# Patient Record
Sex: Female | Born: 1990
Health system: Southern US, Community
[De-identification: ages and names within clinical notes are randomized; demographics above are authoritative.]

## PROBLEM LIST (undated history)

## (undated) DIAGNOSIS — F419 Anxiety disorder, unspecified: Secondary | ICD-10-CM

## (undated) DIAGNOSIS — Z789 Other specified health status: Secondary | ICD-10-CM

## (undated) DIAGNOSIS — F32A Depression, unspecified: Secondary | ICD-10-CM

## (undated) HISTORY — DX: Anxiety disorder, unspecified: F41.9

## (undated) HISTORY — DX: Depression, unspecified: F32.A

## (undated) HISTORY — PX: WISDOM TOOTH EXTRACTION: SHX21

---
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
## 2009-10-07 ENCOUNTER — Ambulatory Visit (HOSPITAL_COMMUNITY): Payer: Self-pay | Admitting: Psychiatry

## 2009-10-15 ENCOUNTER — Ambulatory Visit (HOSPITAL_COMMUNITY): Payer: Self-pay | Admitting: Psychiatry

## 2009-10-25 ENCOUNTER — Ambulatory Visit (HOSPITAL_COMMUNITY): Payer: Self-pay | Admitting: Psychiatry

## 2009-11-20 ENCOUNTER — Ambulatory Visit (HOSPITAL_COMMUNITY): Payer: Self-pay | Admitting: Psychiatry

## 2009-12-04 ENCOUNTER — Ambulatory Visit (HOSPITAL_COMMUNITY): Payer: Self-pay | Admitting: Psychiatry

## 2010-02-08 ENCOUNTER — Encounter: Payer: Self-pay | Admitting: Family Medicine

## 2010-04-22 LAB — URINALYSIS, ROUTINE W REFLEX MICROSCOPIC
Bilirubin Urine: NEGATIVE
Glucose, UA: NEGATIVE mg/dL
Hgb urine dipstick: NEGATIVE
Ketones, ur: NEGATIVE mg/dL
Nitrite: NEGATIVE
Protein, ur: NEGATIVE mg/dL
Specific Gravity, Urine: 1.02 (ref 1.005–1.030)
Urobilinogen, UA: 0.2 mg/dL (ref 0.0–1.0)
pH: 7 (ref 5.0–8.0)

## 2010-04-30 LAB — URINALYSIS, ROUTINE W REFLEX MICROSCOPIC
Bilirubin Urine: NEGATIVE
Nitrite: NEGATIVE
Specific Gravity, Urine: 1.02 (ref 1.005–1.030)
Urobilinogen, UA: 0.2 mg/dL (ref 0.0–1.0)

## 2010-04-30 LAB — POCT I-STAT, CHEM 8
Chloride: 105 mEq/L (ref 96–112)
Creatinine, Ser: 0.8 mg/dL (ref 0.4–1.2)
Glucose, Bld: 86 mg/dL (ref 70–99)
Hemoglobin: 12.9 g/dL (ref 12.0–15.0)
Potassium: 3.7 mEq/L (ref 3.5–5.1)

## 2010-04-30 LAB — URINE MICROSCOPIC-ADD ON

## 2010-10-20 LAB — URINALYSIS, ROUTINE W REFLEX MICROSCOPIC
Glucose, UA: NEGATIVE
Hgb urine dipstick: NEGATIVE
Specific Gravity, Urine: 1.021
pH: 7

## 2012-10-27 ENCOUNTER — Other Ambulatory Visit: Payer: Self-pay | Admitting: Obstetrics & Gynecology

## 2013-05-17 ENCOUNTER — Other Ambulatory Visit: Payer: Self-pay | Admitting: Obstetrics & Gynecology

## 2014-01-08 ENCOUNTER — Other Ambulatory Visit: Payer: Self-pay | Admitting: Obstetrics & Gynecology

## 2014-01-16 ENCOUNTER — Encounter: Payer: Self-pay | Admitting: Obstetrics & Gynecology

## 2014-01-16 ENCOUNTER — Ambulatory Visit (INDEPENDENT_AMBULATORY_CARE_PROVIDER_SITE_OTHER): Payer: 59 | Admitting: Obstetrics & Gynecology

## 2014-01-16 ENCOUNTER — Other Ambulatory Visit (HOSPITAL_COMMUNITY)
Admission: RE | Admit: 2014-01-16 | Discharge: 2014-01-16 | Disposition: A | Discharge status: Home / Self Care | Payer: 59 | Source: Ambulatory Visit | Attending: Obstetrics & Gynecology | Admitting: Obstetrics & Gynecology

## 2014-01-16 VITALS — BP 110/60 | Ht 67.0 in | Wt 125.0 lb

## 2014-01-16 DIAGNOSIS — Z01419 Encounter for gynecological examination (general) (routine) without abnormal findings: Secondary | ICD-10-CM | POA: Diagnosis not present

## 2014-01-16 NOTE — Progress Notes (Signed)
Patient ID: Doris Tyler, female   DOB: 03/03/1990, 23 y.o.   MRN: [id number] Subjective:     Doris Tyler is a 23 y.o. female here for a routine exam.  Patient's last menstrual period was 01/16/2014. No obstetric history on file. Birth Control Method:  megestrol Menstrual Calendar(currently): q 3 months  Current complaints: none.   Current acute medical issues:  none   Recent Gynecologic History Patient's last menstrual period was 01/16/2014. Last Pap: 2012,  normal Last mammogram: na,    History reviewed. No pertinent past medical history.  Past Surgical History  Procedure Laterality Date  . Wisdom tooth extraction      OB History    No data available      History   Social History  . Marital Status: Single    Spouse Name: N/A    Number of Children: N/A  . Years of Education: N/A   Social History Main Topics  . Smoking status: Never Smoker   . Smokeless tobacco: None  . Alcohol Use: None  . Drug Use: None  . Sexual Activity: None   Other Topics Concern  . None   Social History Narrative  . None    History reviewed. No pertinent family history.  Current outpatient prescriptions: megestrol (MEGACE) 40 MG tablet, TAKE 1 TABLET EVERY DAY, Disp: 30 tablet, Rfl: 11  Review of Systems  Review of Systems  Constitutional: Negative for fever, chills, weight loss, malaise/fatigue and diaphoresis.  HENT: Negative for hearing loss, ear pain, nosebleeds, congestion, sore throat, neck pain, tinnitus and ear discharge.   Eyes: Negative for blurred vision, double vision, photophobia, pain, discharge and redness.  Respiratory: Negative for cough, hemoptysis, sputum production, shortness of breath, wheezing and stridor.   Cardiovascular: Negative for chest pain, palpitations, orthopnea, claudication, leg swelling and PND.  Gastrointestinal: negative for abdominal pain. Negative for heartburn, nausea, vomiting, diarrhea, constipation, blood in stool and melena.   Genitourinary: Negative for dysuria, urgency, frequency, hematuria and flank pain.  Musculoskeletal: Negative for myalgias, back pain, joint pain and falls.  Skin: Negative for itching and rash.  Neurological: Negative for dizziness, tingling, tremors, sensory change, speech change, focal weakness, seizures, loss of consciousness, weakness and headaches.  Endo/Heme/Allergies: Negative for environmental allergies and polydipsia. Does not bruise/bleed easily.  Psychiatric/Behavioral: Negative for depression, suicidal ideas, hallucinations, memory loss and substance abuse. The patient is not nervous/anxious and does not have insomnia.        Objective:  Blood pressure 110/60, height 5\' 7"  (1.702 m), weight 125 lb (56.7 kg), last menstrual period 01/16/2014.   Physical Exam  Vitals reviewed. Constitutional: She is oriented to person, place, and time. She appears well-developed and well-nourished.  HENT:  Head: Normocephalic and atraumatic.        Right Ear: External ear normal.  Left Ear: External ear normal.  Nose: Nose normal.  Mouth/Throat: Oropharynx is clear and moist.  Eyes: Conjunctivae and EOM are normal. Pupils are equal, round, and reactive to light. Right eye exhibits no discharge. Left eye exhibits no discharge. No scleral icterus.  Neck: Normal range of motion. Neck supple. No tracheal deviation present. No thyromegaly present.  Cardiovascular: Normal rate, regular rhythm, normal heart sounds and intact distal pulses.  Exam reveals no gallop and no friction rub.   No murmur heard. Respiratory: Effort normal and breath sounds normal. No respiratory distress. She has no wheezes. She has no rales. She exhibits no tenderness.  GI: Soft. Bowel sounds are normal. She exhibits  no distension and no mass. There is no tenderness. There is no rebound and no guarding.  Genitourinary:  Breasts no masses skin changes or nipple changes bilaterally      Vulva is normal without lesions Vagina  is pink moist without discharge Cervix normal in appearance and pap is done Uterus is normal size shape and contour Adnexa is negative with normal sized ovaries    Musculoskeletal: Normal range of motion. She exhibits no edema and no tenderness.  Neurological: She is alert and oriented to person, place, and time. She has normal reflexes. She displays normal reflexes. No cranial nerve deficit. She exhibits normal muscle tone. Coordination normal.  Skin: Skin is warm and dry. No rash noted. No erythema. No pallor.  Psychiatric: She has a normal mood and affect. Her behavior is normal. Judgment and thought content normal.       Assessment:    Healthy female exam.    Plan:    Contraception: progesterone only megestrol. Follow up in: 1 year.

## 2014-01-17 LAB — CYTOLOGY - PAP

## 2014-02-27 ENCOUNTER — Ambulatory Visit: Payer: Self-pay | Admitting: Family Medicine

## 2014-02-28 ENCOUNTER — Ambulatory Visit (INDEPENDENT_AMBULATORY_CARE_PROVIDER_SITE_OTHER): Payer: 59 | Admitting: Family Medicine

## 2014-02-28 ENCOUNTER — Encounter: Payer: Self-pay | Admitting: Family Medicine

## 2014-02-28 VITALS — BP 104/60 | Temp 99.1°F | Ht 67.0 in | Wt 130.0 lb

## 2014-02-28 DIAGNOSIS — R5383 Other fatigue: Secondary | ICD-10-CM

## 2014-02-28 MED ORDER — ONDANSETRON HCL 4 MG PO TABS: ORAL_TABLET | ORAL | Status: DC

## 2014-02-28 NOTE — Progress Notes (Signed)
   Subjective:    Patient ID: Doris Tyler, female    DOB: 09/25/1990, 24 y.o.   MRN: [id number] Patient arrives with new and old problems. Emesis  This is a new problem. Associated symptoms include abdominal pain and diarrhea. Associated symptoms comments: weakness.    First sttarted mon morn when woke up  Stomach was causing sharp pain with cramps  Felt a little better  Started to vomit  Feeling worse  Loose stools and diarrhea, looser than normal  Appetite not the best  Felt a little warm Dim energy  Pt notes spells of wekness, off and on for a long time. Still walks and jogs three to four times per wk  heavt menstrual cycles. Never diagnoses having anemia. States blood work not drawn.  Going on for more than a yr  Eats overall a healthy diet, not on soda drinks a little wate  Eats candy at times  Some stressors but not excessive, does not feel she is depressed, but wonders if her mood is dosnt a bit. Was seeing a counselor peggy bynum Claims no significant depression at this time.  Review of Systems  Gastrointestinal: Positive for vomiting, abdominal pain and diarrhea.   No chest pain no headache no neck pain no abdominal pain decent sleep hygiene ROS otherwise negative    Objective:   Physical Exam   Alert mild malaise no acute distress HEENT neck supple funduscopic exam normal lungs clear. Heart rare rhythm abdomen hyperactive bowel sounds diffuse mild upper abdominal tenderness no discrete point tenderness     Assessment & Plan:  Impression 1 acute viral gastroenteritis #2 chronic fatigue intermittent discussed. Potentially an emotional component having had some counseling lately. Patient curious about blood work status plan 25 minutes spent most in discussion. Exercises sleep hygiene discussed. Zofran. For nausea. Warning signs discussed for abdomen. Dietary changes discussed. Appropriate blood work to assess fatigue along with appropriate screening blood  work. Further recommendations based results. WSL

## 2014-07-16 ENCOUNTER — Other Ambulatory Visit: Payer: Self-pay | Admitting: Obstetrics & Gynecology

## 2014-08-23 ENCOUNTER — Emergency Department (HOSPITAL_COMMUNITY): Payer: 59

## 2014-08-23 ENCOUNTER — Emergency Department (HOSPITAL_COMMUNITY)
Admission: EM | Admit: 2014-08-23 | Discharge: 2014-08-23 | Disposition: A | Discharge status: Home / Self Care | Payer: 59 | Attending: Emergency Medicine | Admitting: Emergency Medicine

## 2014-08-23 ENCOUNTER — Encounter (HOSPITAL_COMMUNITY): Payer: Self-pay | Admitting: Emergency Medicine

## 2014-08-23 DIAGNOSIS — Z3202 Encounter for pregnancy test, result negative: Secondary | ICD-10-CM | POA: Insufficient documentation

## 2014-08-23 DIAGNOSIS — K2970 Gastritis, unspecified, without bleeding: Secondary | ICD-10-CM | POA: Diagnosis not present

## 2014-08-23 DIAGNOSIS — R1013 Epigastric pain: Secondary | ICD-10-CM

## 2014-08-23 DIAGNOSIS — R109 Unspecified abdominal pain: Secondary | ICD-10-CM | POA: Diagnosis present

## 2014-08-23 LAB — CBC WITH DIFFERENTIAL/PLATELET
BASOS PCT: 1 % (ref 0–1)
Basophils Absolute: 0 10*3/uL (ref 0.0–0.1)
Eosinophils Absolute: 0 10*3/uL (ref 0.0–0.7)
Eosinophils Relative: 0 % (ref 0–5)
HEMATOCRIT: 39.3 % (ref 36.0–46.0)
Hemoglobin: 13.5 g/dL (ref 12.0–15.0)
Lymphocytes Relative: 48 % — ABNORMAL HIGH (ref 12–46)
Lymphs Abs: 2.2 10*3/uL (ref 0.7–4.0)
MCH: 32.6 pg (ref 26.0–34.0)
MCHC: 34.4 g/dL (ref 30.0–36.0)
MCV: 94.9 fL (ref 78.0–100.0)
MONO ABS: 0.6 10*3/uL (ref 0.1–1.0)
MONOS PCT: 12 % (ref 3–12)
Neutro Abs: 1.9 10*3/uL (ref 1.7–7.7)
Neutrophils Relative %: 39 % — ABNORMAL LOW (ref 43–77)
PLATELETS: 255 10*3/uL (ref 150–400)
RBC: 4.14 MIL/uL (ref 3.87–5.11)
RDW: 12.6 % (ref 11.5–15.5)
WBC: 4.7 10*3/uL (ref 4.0–10.5)

## 2014-08-23 LAB — COMPREHENSIVE METABOLIC PANEL
ALK PHOS: 66 U/L (ref 38–126)
ALT: 22 U/L (ref 14–54)
ANION GAP: 10 (ref 5–15)
AST: 35 U/L (ref 15–41)
Albumin: 4.3 g/dL (ref 3.5–5.0)
BUN: 14 mg/dL (ref 6–20)
CHLORIDE: 99 mmol/L — AB (ref 101–111)
CO2: 25 mmol/L (ref 22–32)
CREATININE: 0.77 mg/dL (ref 0.44–1.00)
Calcium: 9.1 mg/dL (ref 8.9–10.3)
Glucose, Bld: 82 mg/dL (ref 65–99)
Potassium: 3.7 mmol/L (ref 3.5–5.1)
SODIUM: 134 mmol/L — AB (ref 135–145)
Total Bilirubin: 0.9 mg/dL (ref 0.3–1.2)
Total Protein: 8 g/dL (ref 6.5–8.1)

## 2014-08-23 LAB — URINALYSIS, ROUTINE W REFLEX MICROSCOPIC
GLUCOSE, UA: NEGATIVE mg/dL
Hgb urine dipstick: NEGATIVE
LEUKOCYTES UA: NEGATIVE
Nitrite: NEGATIVE
PH: 6 (ref 5.0–8.0)
Protein, ur: NEGATIVE mg/dL
Specific Gravity, Urine: 1.025 (ref 1.005–1.030)
UROBILINOGEN UA: 1 mg/dL (ref 0.0–1.0)

## 2014-08-23 LAB — POC URINE PREG, ED: Preg Test, Ur: NEGATIVE

## 2014-08-23 LAB — LIPASE, BLOOD: LIPASE: 16 U/L — AB (ref 22–51)

## 2014-08-23 MED ORDER — PANTOPRAZOLE SODIUM 40 MG PO TBEC: 40.0000 mg | DELAYED_RELEASE_TABLET | Freq: Every day | ORAL | Status: DC

## 2014-08-23 MED ORDER — PANTOPRAZOLE SODIUM 40 MG PO TBEC
40.0000 mg | DELAYED_RELEASE_TABLET | Freq: Once | ORAL | Status: AC
  Administered 2014-08-23: 40 mg via ORAL
  Filled 2014-08-23: qty 1

## 2014-08-23 MED ORDER — METOCLOPRAMIDE HCL 5 MG/ML IJ SOLN
10.0000 mg | Freq: Once | INTRAMUSCULAR | Status: AC
  Administered 2014-08-23: 10 mg via INTRAVENOUS
  Filled 2014-08-23: qty 2

## 2014-08-23 NOTE — ED Notes (Signed)
abdominal pain x 1 week, woke her up during the night, nausea, not vomiting or diarrhea

## 2014-08-23 NOTE — Discharge Instructions (Signed)
Gastritis, Adult °Gastritis is soreness and puffiness (inflammation) of the lining of the stomach. If you do not get help, gastritis can cause bleeding and sores (ulcers) in the stomach. °HOME CARE  °· Only take medicine as told by your doctor. °· If you were given antibiotic medicines, take them as told. Finish the medicines even if you start to feel better. °· Drink enough fluids to keep your pee (urine) clear or pale yellow. °· Avoid foods and drinks that make your problems worse. Foods you may want to avoid include: °¨ Caffeine or alcohol. °¨ Chocolate. °¨ Mint. °¨ Garlic and onions. °¨ Spicy foods. °¨ Citrus fruits, including oranges, lemons, or limes. °¨ Food containing tomatoes, including sauce, chili, salsa, and pizza. °¨ Fried and fatty foods. °· Eat small meals throughout the day instead of large meals. °GET HELP RIGHT AWAY IF:  °· You have black or dark red poop (stools). °· You throw up (vomit) blood. It may look like coffee grounds. °· You cannot keep fluids down. °· Your belly (abdominal) pain gets worse. °· You have a fever. °· You do not feel better after 1 week. °· You have any other questions or concerns. °MAKE SURE YOU:  °· Understand these instructions. °· Will watch your condition. °· Will get help right away if you are not doing well or get worse. °Document Released: 06/24/2007 Document Revised: 03/30/2011 Document Reviewed: 02/18/2011 °[employer]® Patient Information ©2015 [employer]. This information is not intended to replace advice given to you by your health care provider. Make sure you discuss any questions you have with your health care provider. ° °

## 2014-08-26 NOTE — ED Provider Notes (Signed)
CSN: [id number]     Arrival date & time 08/23/14  4098 History   First MD Initiated Contact with Patient 08/23/14 1041     Chief Complaint  Patient presents with  . Abdominal Pain     (Consider location/radiation/quality/duration/timing/severity/associated sxs/prior Treatment) HPI   Doris Tyler is a 24 y.o. female who presents to the Emergency Department complaining of abdominal pain for one week.  Pain has been intermittent but became worse on the evening prior to ed arrival.  She describes a dull, intermittently sharp pain to her upper abdominal pain.  She reports nausea without vomiting or diarrhea.  Pain is not associated with food intake.  She denies fever, chills, back pain, shortness of breath, dysuria or pelvic pain.  Nothing makes the better better or worse.     History reviewed. No pertinent past medical history. Past Surgical History  Procedure Laterality Date  . Wisdom tooth extraction     No family history on file. History  Substance Use Topics  . Smoking status: Never Smoker   . Smokeless tobacco: Not on file  . Alcohol Use: 0.0 oz/week    0 Standard drinks or equivalent per week     Comment: occ   OB History    No data available     Review of Systems  Constitutional: Negative for fever, chills and appetite change.  Respiratory: Negative for chest tightness and shortness of breath.   Cardiovascular: Negative for chest pain.  Gastrointestinal: Positive for nausea and abdominal pain. Negative for vomiting, diarrhea and blood in stool.  Genitourinary: Negative for dysuria, flank pain, decreased urine volume, vaginal bleeding, vaginal discharge, difficulty urinating and pelvic pain.  Musculoskeletal: Negative for back pain.  Skin: Negative for color change and rash.  Neurological: Negative for dizziness, weakness and numbness.  Hematological: Negative for adenopathy.  All other systems reviewed and are negative.     Allergies  Review of patient's  allergies indicates no known allergies.  Home Medications   Prior to Admission medications   Medication Sig Start Date End Date Taking? Authorizing Provider  megestrol (MEGACE) 40 MG tablet TAKE 1 TABLET EVERY DAY 07/17/14  Yes Lazaro Arms, MD  Phenylephrine-DM-GG-APAP (MUCINEX FAST-MAX SEVERE COLD) 5-10-200-325 MG TABS Take 2 tablets by mouth daily as needed (cold).   Yes Historical Provider, MD  ondansetron (ZOFRAN) 4 MG tablet Take 1 tablet po every 6-8 hours prn Patient not taking: Reported on 08/23/2014 02/28/14   Merlyn Albert, MD  pantoprazole (PROTONIX) 40 MG tablet Take 1 tablet (40 mg total) by mouth daily. 08/23/14   Raphel Stickles, PA-C   BP 124/82 mmHg  Pulse 76  Temp(Src) 98.3 F (36.8 C) (Oral)  Resp 16  Ht 5\' 8"  (1.727 m)  Wt 125 lb (56.7 kg)  BMI 19.01 kg/m2  SpO2 100%  LMP 07/23/2014 Physical Exam  Constitutional: She is oriented to person, place, and time. She appears well-developed and well-nourished. No distress.  HENT:  Head: Normocephalic and atraumatic.  Mouth/Throat: Oropharynx is clear and moist.  Cardiovascular: Normal rate, regular rhythm, normal heart sounds and intact distal pulses.   No murmur heard. Pulmonary/Chest: Effort normal and breath sounds normal. No respiratory distress.  Abdominal: Soft. Normal appearance and bowel sounds are normal. She exhibits no distension and no mass. There is no hepatosplenomegaly. There is tenderness in the epigastric area. There is no rebound, no guarding and no CVA tenderness.  epigastric pain.  No guarding or rebound tenderness.  No RLQ tenderness.  Musculoskeletal: Normal range of motion. She exhibits no edema.  Neurological: She is alert and oriented to person, place, and time. She exhibits normal muscle tone. Coordination normal.  Skin: Skin is warm and dry.  Nursing note and vitals reviewed.   ED Course  Procedures (including critical care time) Labs Review Labs Reviewed  LIPASE, BLOOD - Abnormal;  Notable for the following:    Lipase 16 (*)    All other components within normal limits  COMPREHENSIVE METABOLIC PANEL - Abnormal; Notable for the following:    Sodium 134 (*)    Chloride 99 (*)    All other components within normal limits  URINALYSIS, ROUTINE W REFLEX MICROSCOPIC (NOT AT [facility]) - Abnormal; Notable for the following:    Bilirubin Urine SMALL (*)    Ketones, ur >80 (*)    All other components within normal limits  CBC WITH DIFFERENTIAL/PLATELET - Abnormal; Notable for the following:    Neutrophils Relative % 39 (*)    Lymphocytes Relative 48 (*)    All other components within normal limits  POC URINE PREG, ED    Imaging Review US Abdomen Limited  08/23/2014   CLINICAL DATA:  Right upper quadrant and epigastric pain for 1 week with nausea  EXAM: US ABDOMEN LIMITED - RIGHT UPPER QUADRANT  COMPARISON:  None.  FINDINGS: Gallbladder:  No gallstones or wall thickening visualized. There is no pericholecystic fluid. No sonographic Murphy sign noted.  Common bile duct:  Diameter: 2 mm. There is no intrahepatic or extrahepatic biliary duct dilatation.  Liver:  No focal lesion identified. Within normal limits in parenchymal echogenicity.  IMPRESSION: Study within normal limits.   Electronically Signed   By: Bretta Bang III M.D.   On: 08/23/2014 14:10     EKG Interpretation None      MDM   Final diagnoses:  Gastritis  Epigastric pain    Pt is well appearing, vitals stable.  [place] is neg for GB dz.  No concerning sx's for surgical abd.  Sx's likely related to gastritis.    She is feeling better after medication.  Appears stable for d/c.  Agrees to close PMD f/u and advised to return here for any worsening sx's.      Pauline Aus, PA-C 08/26/14 1930  Vanetta Mulders, MD 09/08/14 [phone]

## 2014-12-17 ENCOUNTER — Ambulatory Visit (INDEPENDENT_AMBULATORY_CARE_PROVIDER_SITE_OTHER): Payer: 59 | Admitting: Family Medicine

## 2014-12-17 ENCOUNTER — Encounter: Payer: Self-pay | Admitting: Family Medicine

## 2014-12-17 VITALS — Temp 98.6°F | Ht 67.0 in | Wt 133.6 lb

## 2014-12-17 DIAGNOSIS — R1013 Epigastric pain: Secondary | ICD-10-CM

## 2014-12-17 NOTE — Progress Notes (Signed)
   Subjective:    Patient ID: Doris Tyler, female    DOB: 07/01/1990, 24 y.o.   MRN: [id number]  Abdominal Pain This is a new problem. The current episode started today. Associated symptoms include diarrhea and nausea. Pertinent negatives include no fever.   Patient with significant abdominal pain some vomiting and diarrhea earlier today abdominal pain somewhat better nausea is better she denies high fever chills no prior troubles with this Patient did go to the ER back in August for similar symptoms, ultrasound negative for any type of gallstones  Review of Systems  Constitutional: Negative for fever and fatigue.  HENT: Negative for congestion.   Respiratory: Negative for cough.   Gastrointestinal: Positive for nausea, abdominal pain and diarrhea.       Objective:   Physical Exam  Constitutional: She appears well-developed.  Cardiovascular: Normal rate, regular rhythm and normal heart sounds.   No murmur heard. Pulmonary/Chest: Effort normal and breath sounds normal.  Abdominal: Soft. She exhibits no distension. There is no tenderness.  Neurological: She is alert.  Skin: Skin is warm and dry.          Assessment & Plan:  Significant abdominal pain. Recent problems in August. Will need to do some lab work. I doubt any underlying serious condition but we need to rule out other things if this becomes a reoccurring problem extent would be referral to gastroenterology

## 2014-12-18 LAB — TISSUE TRANSGLUTAMINASE, IGA

## 2014-12-18 LAB — CBC WITH DIFFERENTIAL/PLATELET
BASOS: 0 %
Basophils Absolute: 0 10*3/uL (ref 0.0–0.2)
EOS (ABSOLUTE): 0 10*3/uL (ref 0.0–0.4)
EOS: 1 %
HEMATOCRIT: 40.7 % (ref 34.0–46.6)
Hemoglobin: 13.8 g/dL (ref 11.1–15.9)
Immature Grans (Abs): 0 10*3/uL (ref 0.0–0.1)
Immature Granulocytes: 0 %
LYMPHS ABS: 0.9 10*3/uL (ref 0.7–3.1)
Lymphs: 19 %
MCH: 31.6 pg (ref 26.6–33.0)
MCHC: 33.9 g/dL (ref 31.5–35.7)
MCV: 93 fL (ref 79–97)
MONOS ABS: 0.3 10*3/uL (ref 0.1–0.9)
Monocytes: 6 %
Neutrophils Absolute: 3.5 10*3/uL (ref 1.4–7.0)
Neutrophils: 74 %
PLATELETS: 238 10*3/uL (ref 150–379)
RBC: 4.37 x10E6/uL (ref 3.77–5.28)
RDW: 13.4 % (ref 12.3–15.4)
WBC: 4.8 10*3/uL (ref 3.4–10.8)

## 2014-12-18 LAB — SEDIMENTATION RATE: Sed Rate: 4 mm/hr (ref 0–32)

## 2014-12-18 LAB — LIPASE: LIPASE: 21 U/L (ref 0–59)

## 2014-12-19 ENCOUNTER — Ambulatory Visit (INDEPENDENT_AMBULATORY_CARE_PROVIDER_SITE_OTHER): Payer: 59 | Admitting: Family Medicine

## 2014-12-19 ENCOUNTER — Encounter: Payer: Self-pay | Admitting: Family Medicine

## 2014-12-19 VITALS — Temp 98.6°F | Ht 67.0 in | Wt 133.0 lb

## 2014-12-19 DIAGNOSIS — R1011 Right upper quadrant pain: Secondary | ICD-10-CM | POA: Diagnosis not present

## 2014-12-19 NOTE — Progress Notes (Signed)
   Subjective:    Patient ID: Doris DoverIllya C Tyler, female    DOB: 11/15/1990, 24 y.o.   MRN: [id number]  HPI Patient arrives with worsening abdominal pain- vomiting and diarrhea is better.  Patient states over the past 24 hours especially since early this morning severe right upper quadrant tenderness denies sweats chills nausea vomiting diarrhea states energy level subpar. Review of Systems    patient relates nausea denies diarrhea denies fever chills Objective:   Physical Exam  Lungs clear hearts regular moderate right upper quadrant tenderness with palpation no guarding rebound rest and abdominal exam is normal positive bowel sounds no guarding rebound      Assessment & Plan:  With significant right upper quadrant tenderness in her recent ultrasound a few months ago which is negative I do recommend HIDA test if this is negative patient will probably need endoscopy from gastroenterology go ahead and initiate gastroenterology referral patient to follow-up if problems no further lab work needed no further medications needed bland diet frequent meals on a regular basis recommended if high fevers bloody stools or worse follow-up

## 2014-12-20 ENCOUNTER — Encounter: Payer: Self-pay | Admitting: Family Medicine

## 2014-12-21 ENCOUNTER — Other Ambulatory Visit: Payer: Self-pay

## 2014-12-21 ENCOUNTER — Encounter (HOSPITAL_COMMUNITY)
Admission: RE | Admit: 2014-12-21 | Discharge: 2014-12-21 | Disposition: A | Discharge status: Home / Self Care | Payer: 59 | Source: Ambulatory Visit | Attending: Family Medicine | Admitting: Family Medicine

## 2014-12-21 ENCOUNTER — Encounter (HOSPITAL_COMMUNITY): Payer: Self-pay

## 2014-12-21 DIAGNOSIS — R109 Unspecified abdominal pain: Secondary | ICD-10-CM

## 2014-12-21 DIAGNOSIS — R1011 Right upper quadrant pain: Secondary | ICD-10-CM | POA: Diagnosis present

## 2014-12-21 MED ORDER — STERILE WATER FOR INJECTION IJ SOLN
INTRAMUSCULAR | Status: AC
  Administered 2014-12-21: 5 mL
  Filled 2014-12-21: qty 10

## 2014-12-21 MED ORDER — SINCALIDE 5 MCG IJ SOLR
INTRAMUSCULAR | Status: AC
Start: 2014-12-21 — End: 2014-12-21
  Administered 2014-12-21: 1.23 ug
  Filled 2014-12-21: qty 5

## 2014-12-21 MED ORDER — TECHNETIUM TC 99M MEBROFENIN IV KIT
5.0000 | PACK | Freq: Once | INTRAVENOUS | Status: AC | PRN
  Administered 2014-12-21: 5 via INTRAVENOUS

## 2014-12-24 ENCOUNTER — Encounter: Payer: Self-pay | Admitting: Internal Medicine

## 2015-01-04 ENCOUNTER — Encounter: Payer: Self-pay | Admitting: Gastroenterology

## 2015-01-04 ENCOUNTER — Other Ambulatory Visit: Payer: Self-pay

## 2015-01-04 ENCOUNTER — Ambulatory Visit (INDEPENDENT_AMBULATORY_CARE_PROVIDER_SITE_OTHER): Payer: 59 | Admitting: Gastroenterology

## 2015-01-04 VITALS — BP 118/80 | HR 110 | Temp 97.9°F | Ht 67.0 in | Wt 136.6 lb

## 2015-01-04 DIAGNOSIS — R1013 Epigastric pain: Secondary | ICD-10-CM

## 2015-01-04 DIAGNOSIS — R1011 Right upper quadrant pain: Secondary | ICD-10-CM | POA: Insufficient documentation

## 2015-01-04 DIAGNOSIS — R1012 Left upper quadrant pain: Secondary | ICD-10-CM

## 2015-01-04 NOTE — Patient Instructions (Signed)
1. Align once daily. Samples provided. 2. Upper endoscopy with Dr. Jena Gaussourk. See separate instructions.

## 2015-01-04 NOTE — Progress Notes (Signed)
Primary Care Physician:  Lubertha South, MD  Primary Gastroenterologist:  Roetta Sessions, MD   Chief Complaint  Patient presents with  . Abdominal Pain    HPI:  Doris Tyler is a 24 y.o. female here for further evaluation of abdominal pain at the request of Dr. Gerda Diss. Patient states she has always had stomach issues since childhood. She remembers staying home from school because of abdominal pain. It was always blamed on stress although she denies ever feeling stressed. Chronically she has vague abdominal pain not usually associated with vomiting or diarrhea. However proximally once per week at the most she may have few loose stools. He may go several weeks in between without any problems. In between she has normal daily bowel movement. However over the past 4 months she's had a few episodes of severe upper abdominal pain epigastric and right upper quadrant associated with vomiting and one time with some diarrhea. She went to the emergency department in August for an episode. Lab work was unremarkable. Saw PCP last month for similar episode. Of note she has been woken up from sleep with this abdominal pain. Recent labs again unremarkable for celiac screen, lipase, CBC. LFTs were normal back in August. She recently had abdominal ultrasound and HIDA scan which were unremarkable.  She was started on pantoprazole recently, short course. Really hasn't noted any benefit. Denies any NSAID or aspirin use although used to use aspirin powders for headache last year. She wonders if there is something she is eating that is causing her abdominal discomfort. She complains of excess gas and bloating. Denies constipation. No blood in the stool or melena. Denies heartburn. No unintentional weight loss.  She reports very healthy diet. She does not eat junk food or soft drinks. She consumes mostly water. Eats lots of fruits and vegetables. She is very active and exercises regularly. Rarely consumes dairy.     Current  Outpatient Prescriptions  Medication Sig Dispense Refill  . megestrol (MEGACE) 40 MG tablet TAKE 1 TABLET EVERY DAY 30 tablet 11   No current facility-administered medications for this visit.    Allergies as of 01/04/2015  . (No Known Allergies)    No past medical history on file.  Past Surgical History  Procedure Laterality Date  . Wisdom tooth extraction      Family History  Problem Relation Age of Onset  . Colon cancer Neg Hx   . Inflammatory bowel disease Neg Hx   . Celiac disease Neg Hx     Social History   Social History  . Marital Status: Single    Spouse Name: N/A  . Number of Children: 0  . Years of Education: N/A   Occupational History  . [employer]    Social History Main Topics  . Smoking status: Never Smoker   . Smokeless tobacco: Not on file  . Alcohol Use: 0.0 oz/week    0 Standard drinks or equivalent per week     Comment: occ  . Drug Use: No  . Sexual Activity: Not on file   Other Topics Concern  . Not on file   Social History Narrative      ROS:  General: Negative for anorexia, weight loss, fever, chills, fatigue, weakness. Eyes: Negative for vision changes.  ENT: Negative for hoarseness, difficulty swallowing , nasal congestion. CV: Negative for chest pain, angina, palpitations, dyspnea on exertion, peripheral edema.  Respiratory: Negative for dyspnea at rest, dyspnea on exertion, cough, sputum, wheezing.  GI: See history of  present illness. GU:  Negative for dysuria, hematuria, urinary incontinence, urinary frequency, nocturnal urination.  MS: Negative for joint pain, low back pain.  Derm: Negative for rash or itching.  Neuro: Negative for weakness, abnormal sensation, seizure, frequent headaches, memory loss, confusion.  Psych: Negative for anxiety, depression, suicidal ideation, hallucinations.  Endo: Negative for unusual weight change.  Heme: Negative for bruising or bleeding. Allergy: Negative for rash or hives.     Physical Examination:  BP 118/80 mmHg  Pulse 110  Temp(Src) 97.9 F (36.6 C) (Oral)  Ht 5\' 7"  (1.702 m)  Wt 136 lb 9.6 oz (61.961 kg)  BMI 21.39 kg/m2  LMP 12/07/2014 (Approximate)   General: Well-nourished, well-developed in no acute distress.  Head: Normocephalic, atraumatic.   Eyes: Conjunctiva pink, no icterus. Mouth: Oropharyngeal mucosa moist and pink , no lesions erythema or exudate. Neck: Supple without thyromegaly, masses, or lymphadenopathy.  Lungs: Clear to auscultation bilaterally.  Heart: Regular rate and rhythm, no murmurs rubs or gallops.  Abdomen: Bowel sounds are normal, mild right upper quadrant tenderness, nondistended, no hepatosplenomegaly or masses, no abdominal bruits or    hernia , no rebound or guarding.   Rectal: Not performed Extremities: No lower extremity edema. No clubbing or deformities.  Neuro: Alert and oriented x 4 , grossly normal neurologically.  Skin: Warm and dry, no rash or jaundice.   Psych: Alert and cooperative, normal mood and affect.  Labs: Lab Results  Component Value Date   LIPASE 21 12/17/2014   Lab Results  Component Value Date   ESRSEDRATE 4 12/17/2014   Lab Results  Component Value Date   WBC 4.8 12/17/2014   HGB 13.5 08/23/2014   HCT 40.7 12/17/2014   MCV 94.9 08/23/2014   PLT 255 08/23/2014   Lab Results  Component Value Date   CREATININE 0.77 08/23/2014   BUN 14 08/23/2014   NA 134* 08/23/2014   K 3.7 08/23/2014   CL 99* 08/23/2014   CO2 25 08/23/2014     Imaging Studies: Nm Hepato W/eject Fract  12/21/2014  CLINICAL DATA:  Two weeks of right upper quadrant abdominal pain radiating to the back ; touch tenderness in the right upper quadrant, no nausea or vomiting and discomfort does not appear to be related to food ingestion. History of gastritis. EXAM: NUCLEAR MEDICINE HEPATOBILIARY IMAGING WITH GALLBLADDER EF TECHNIQUE: Sequential images of the abdomen were obtained out to 60 minutes following intravenous  administration of radiopharmaceutical. After slow intravenous infusion of 1.23 micrograms Cholecystokinin, gallbladder ejection fraction was determined. RADIOPHARMACEUTICALS:  5.2 mCi Tc-2938m Choletec IV COMPARISON:  Abdominal ultrasound of August 23, 2014 FINDINGS: There is adequate uptake of the radiopharmaceutical by the liver. The intrahepatic ducts are visible by 5 minutes and the gallbladder by 10 minutes. Bowel activity is evident by 15 minutes. Following CCK injection the 45 minutes gallbladder ejection fraction is 99%. At 45 min, normal ejection fraction is greater than 40%. IMPRESSION: Normal hepatobiliary scan with normal gallbladder ejection fraction. Electronically Signed   By: David  SwazilandJordan M.D.   On: 12/21/2014 10:45   Impression/plan:  24 year old female with chronic vague abdominal discomfort who presents for further evaluation of acute on chronic abdominal pain. Recent episodes of epigastric/right upper quadrant abdominal pain associated with vomiting initially, last episode with some diarrhea. Initial episode woke her up from sleep. Does not seem to be related to meals. Gallbladder workup negative. Labs unremarkable as outlined above. She has vague dyspepsia-like symptoms were chronically but does have some days with no  abdominal pain. Really does not describe significant bowel issues on a regular basis which makes IBS less likely. Given ongoing symptoms, would offer her an upper endoscopy for further evaluation of upper abdominal pain. This may not explain all of her chronic vague symptoms which may need to have further investigation. Await EGD findings.  I have discussed the risks, alternatives, benefits with regards to but not limited to the risk of reaction to medication, bleeding, infection, perforation and the patient is agreeable to proceed. Written consent to be obtained.  Empirically start Align once daily in the interim. Samples provided.

## 2015-01-22 ENCOUNTER — Telehealth: Payer: Self-pay

## 2015-01-22 NOTE — Telephone Encounter (Signed)
PA# for EGD is [id number]

## 2015-01-23 ENCOUNTER — Encounter (HOSPITAL_COMMUNITY)
Admission: RE | Disposition: A | Discharge status: Home / Self Care | Payer: Self-pay | Source: Ambulatory Visit | Attending: Internal Medicine

## 2015-01-23 ENCOUNTER — Encounter (HOSPITAL_COMMUNITY): Payer: Self-pay | Admitting: *Deleted

## 2015-01-23 ENCOUNTER — Ambulatory Visit (HOSPITAL_COMMUNITY)
Admission: RE | Admit: 2015-01-23 | Discharge: 2015-01-23 | Disposition: A | Discharge status: Home / Self Care | Payer: 59 | Source: Ambulatory Visit | Attending: Internal Medicine | Admitting: Internal Medicine

## 2015-01-23 DIAGNOSIS — R1013 Epigastric pain: Secondary | ICD-10-CM | POA: Diagnosis not present

## 2015-01-23 DIAGNOSIS — K319 Disease of stomach and duodenum, unspecified: Secondary | ICD-10-CM | POA: Insufficient documentation

## 2015-01-23 DIAGNOSIS — R1012 Left upper quadrant pain: Secondary | ICD-10-CM

## 2015-01-23 DIAGNOSIS — K3189 Other diseases of stomach and duodenum: Secondary | ICD-10-CM | POA: Diagnosis not present

## 2015-01-23 HISTORY — PX: ESOPHAGOGASTRODUODENOSCOPY: SHX5428

## 2015-01-23 SURGERY — EGD (ESOPHAGOGASTRODUODENOSCOPY)
Anesthesia: Moderate Sedation

## 2015-01-23 MED ORDER — MIDAZOLAM HCL 5 MG/5ML IJ SOLN
INTRAMUSCULAR | Status: DC | PRN
  Administered 2015-01-23 (×2): 2 mg via INTRAVENOUS
  Administered 2015-01-23: 1 mg via INTRAVENOUS

## 2015-01-23 MED ORDER — MIDAZOLAM HCL 5 MG/5ML IJ SOLN
INTRAMUSCULAR | Status: AC
  Filled 2015-01-23: qty 10

## 2015-01-23 MED ORDER — PROMETHAZINE HCL 25 MG/ML IJ SOLN
12.5000 mg | Freq: Once | INTRAMUSCULAR | Status: AC
  Administered 2015-01-23: 12.5 mg via INTRAVENOUS

## 2015-01-23 MED ORDER — SODIUM CHLORIDE 0.9 % IJ SOLN
INTRAMUSCULAR | Status: AC
  Filled 2015-01-23: qty 3

## 2015-01-23 MED ORDER — STERILE WATER FOR IRRIGATION IR SOLN
Status: DC | PRN
  Administered 2015-01-23: 10:00:00

## 2015-01-23 MED ORDER — PROMETHAZINE HCL 25 MG/ML IJ SOLN
INTRAMUSCULAR | Status: AC
  Filled 2015-01-23: qty 1

## 2015-01-23 MED ORDER — LIDOCAINE VISCOUS 2 % MT SOLN
OROMUCOSAL | Status: DC | PRN
  Administered 2015-01-23: 4 mL via OROMUCOSAL

## 2015-01-23 MED ORDER — LIDOCAINE VISCOUS 2 % MT SOLN
OROMUCOSAL | Status: AC
  Filled 2015-01-23: qty 15

## 2015-01-23 MED ORDER — SODIUM CHLORIDE 0.9 % IV SOLN
INTRAVENOUS | Status: DC
  Administered 2015-01-23: 09:00:00 via INTRAVENOUS

## 2015-01-23 MED ORDER — MEPERIDINE HCL 100 MG/ML IJ SOLN
INTRAMUSCULAR | Status: AC
  Filled 2015-01-23: qty 2

## 2015-01-23 MED ORDER — MEPERIDINE HCL 100 MG/ML IJ SOLN
INTRAMUSCULAR | Status: DC | PRN
  Administered 2015-01-23: 50 mg via INTRAVENOUS
  Administered 2015-01-23: 25 mg via INTRAVENOUS
  Administered 2015-01-23: 50 mg via INTRAVENOUS

## 2015-01-23 MED ORDER — ONDANSETRON HCL 4 MG/2ML IJ SOLN
INTRAMUSCULAR | Status: AC
  Filled 2015-01-23: qty 2

## 2015-01-23 MED ORDER — ONDANSETRON HCL 4 MG/2ML IJ SOLN
INTRAMUSCULAR | Status: DC | PRN
  Administered 2015-01-23: 4 mg via INTRAVENOUS

## 2015-01-23 NOTE — Interval H&P Note (Signed)
History and Physical Interval Note:  01/23/2015 10:04 AM  Doris Tyler  has presented today for surgery, with the diagnosis of Left upper Quadrant pain, epigastric pain  The various methods of treatment have been discussed with the patient and family. After consideration of risks, benefits and other options for treatment, the patient has consented to  Procedure(s) with comments: ESOPHAGOGASTRODUODENOSCOPY (EGD) (N/A) - 1610- 0925 as a surgical intervention .  The patient's history has been reviewed, patient examined, no change in status, stable for surgery.  I have reviewed the patient's chart and labs.  Questions were answered to the patient's satisfaction.     Doris Tyler    Alignment has helped with her symptoms to some degree. Diagnostic EGD per plan. The risks, benefits, limitations, alternatives and imponderables have been reviewed with the patient. Potential for esophageal dilation, biopsy, etc. have also been reviewed.  Questions have been answered. All parties agreeable.

## 2015-01-23 NOTE — H&P (View-Only) (Signed)
Primary Care Physician:  Doris SouthSteve Luking, MD  Primary Gastroenterologist:  Roetta SessionsMichael Rourk, MD   Chief Complaint  Patient presents with  . Abdominal Pain    HPI:  Doris Tyler is a 25 y.o. female here for further evaluation of abdominal pain at the request of Dr. Gerda DissLuking. Patient states she has always had stomach issues since childhood. She remembers staying home from school because of abdominal pain. It was always blamed on stress although she denies ever feeling stressed. Chronically she has vague abdominal pain not usually associated with vomiting or diarrhea. However proximally once per week at the most she may have few loose stools. He may go several weeks in between without any problems. In between she has normal daily bowel movement. However over the past 4 months she's had a few episodes of severe upper abdominal pain epigastric and right upper quadrant associated with vomiting and one time with some diarrhea. She went to the emergency department in August for an episode. Lab work was unremarkable. Saw PCP last month for similar episode. Of note she has been woken up from sleep with this abdominal pain. Recent labs again unremarkable for celiac screen, lipase, CBC. LFTs were normal back in August. She recently had abdominal ultrasound and HIDA scan which were unremarkable.  She was started on pantoprazole recently, short course. Really hasn't noted any benefit. Denies any NSAID or aspirin use although used to use aspirin powders for headache last year. She wonders if there is something she is eating that is causing her abdominal discomfort. She complains of excess gas and bloating. Denies constipation. No blood in the stool or melena. Denies heartburn. No unintentional weight loss.  She reports very healthy diet. She does not eat junk food or soft drinks. She consumes mostly water. Eats lots of fruits and vegetables. She is very active and exercises regularly. Rarely consumes dairy.     Current  Outpatient Prescriptions  Medication Sig Dispense Refill  . megestrol (MEGACE) 40 MG tablet TAKE 1 TABLET EVERY DAY 30 tablet 11   No current facility-administered medications for this visit.    Allergies as of 01/04/2015  . (No Known Allergies)    No past medical history on file.  Past Surgical History  Procedure Laterality Date  . Wisdom tooth extraction      Family History  Problem Relation Age of Onset  . Colon cancer Neg Hx   . Inflammatory bowel disease Neg Hx   . Celiac disease Neg Hx     Social History   Social History  . Marital Status: Single    Spouse Name: N/A  . Number of Children: 0  . Years of Education: N/A   Occupational History  . [employer]    Social History Main Topics  . Smoking status: Never Smoker   . Smokeless tobacco: Not on file  . Alcohol Use: 0.0 oz/week    0 Standard drinks or equivalent per week     Comment: occ  . Drug Use: No  . Sexual Activity: Not on file   Other Topics Concern  . Not on file   Social History Narrative      ROS:  General: Negative for anorexia, weight loss, fever, chills, fatigue, weakness. Eyes: Negative for vision changes.  ENT: Negative for hoarseness, difficulty swallowing , nasal congestion. CV: Negative for chest pain, angina, palpitations, dyspnea on exertion, peripheral edema.  Respiratory: Negative for dyspnea at rest, dyspnea on exertion, cough, sputum, wheezing.  GI: See history of  present illness. GU:  Negative for dysuria, hematuria, urinary incontinence, urinary frequency, nocturnal urination.  MS: Negative for joint pain, low back pain.  Derm: Negative for rash or itching.  Neuro: Negative for weakness, abnormal sensation, seizure, frequent headaches, memory loss, confusion.  Psych: Negative for anxiety, depression, suicidal ideation, hallucinations.  Endo: Negative for unusual weight change.  Heme: Negative for bruising or bleeding. Allergy: Negative for rash or hives.     Physical Examination:  BP 118/80 mmHg  Pulse 110  Temp(Src) 97.9 F (36.6 C) (Oral)  Ht 5\' 7"  (1.702 m)  Wt 136 lb 9.6 oz (61.961 kg)  BMI 21.39 kg/m2  LMP 12/07/2014 (Approximate)   General: Well-nourished, well-developed in no acute distress.  Head: Normocephalic, atraumatic.   Eyes: Conjunctiva pink, no icterus. Mouth: Oropharyngeal mucosa moist and pink , no lesions erythema or exudate. Neck: Supple without thyromegaly, masses, or lymphadenopathy.  Lungs: Clear to auscultation bilaterally.  Heart: Regular rate and rhythm, no murmurs rubs or gallops.  Abdomen: Bowel sounds are normal, mild right upper quadrant tenderness, nondistended, no hepatosplenomegaly or masses, no abdominal bruits or    hernia , no rebound or guarding.   Rectal: Not performed Extremities: No lower extremity edema. No clubbing or deformities.  Neuro: Alert and oriented x 4 , grossly normal neurologically.  Skin: Warm and dry, no rash or jaundice.   Psych: Alert and cooperative, normal mood and affect.  Labs: Lab Results  Component Value Date   LIPASE 21 12/17/2014   Lab Results  Component Value Date   ESRSEDRATE 4 12/17/2014   Lab Results  Component Value Date   WBC 4.8 12/17/2014   HGB 13.5 08/23/2014   HCT 40.7 12/17/2014   MCV 94.9 08/23/2014   PLT 255 08/23/2014   Lab Results  Component Value Date   CREATININE 0.77 08/23/2014   BUN 14 08/23/2014   NA 134* 08/23/2014   K 3.7 08/23/2014   CL 99* 08/23/2014   CO2 25 08/23/2014     Imaging Studies: Nm Hepato W/eject Fract  12/21/2014  CLINICAL DATA:  Two weeks of right upper quadrant abdominal pain radiating to the back ; touch tenderness in the right upper quadrant, no nausea or vomiting and discomfort does not appear to be related to food ingestion. History of gastritis. EXAM: NUCLEAR MEDICINE HEPATOBILIARY IMAGING WITH GALLBLADDER EF TECHNIQUE: Sequential images of the abdomen were obtained out to 60 minutes following intravenous  administration of radiopharmaceutical. After slow intravenous infusion of 1.23 micrograms Cholecystokinin, gallbladder ejection fraction was determined. RADIOPHARMACEUTICALS:  5.2 mCi Tc-2938m Choletec IV COMPARISON:  Abdominal ultrasound of August 23, 2014 FINDINGS: There is adequate uptake of the radiopharmaceutical by the liver. The intrahepatic ducts are visible by 5 minutes and the gallbladder by 10 minutes. Bowel activity is evident by 15 minutes. Following CCK injection the 45 minutes gallbladder ejection fraction is 99%. At 45 min, normal ejection fraction is greater than 40%. IMPRESSION: Normal hepatobiliary scan with normal gallbladder ejection fraction. Electronically Signed   By: David  [place]il[place]Jordan M.D.   On: 12/21/2014 10:45   Impression/plan:  25 year old female with chronic vague abdominal discomfort who presents for further evaluation of acute on chronic abdominal pain. Recent episodes of epigastric/right upper quadrant abdominal pain associated with vomiting initially, last episode with some diarrhea. Initial episode woke her up from sleep. Does not seem to be related to meals. Gallbladder workup negative. Labs unremarkable as outlined above. She has vague dyspepsia-like symptoms were chronically but does have some days with no  abdominal pain. Really does not describe significant bowel issues on a regular basis which makes IBS less likely. Given ongoing symptoms, would offer her an upper endoscopy for further evaluation of upper abdominal pain. This may not explain all of her chronic vague symptoms which may need to have further investigation. Await EGD findings.  I have discussed the risks, alternatives, benefits with regards to but not limited to the risk of reaction to medication, bleeding, infection, perforation and the patient is agreeable to proceed. Written consent to be obtained.  Empirically start Align once daily in the interim. Samples provided.     

## 2015-01-23 NOTE — Progress Notes (Signed)
Please excuse Doris Tyler from work today 01/23/2015 and Thursday 01/24/2015. She may return on Friday 01/25/2015.

## 2015-01-23 NOTE — Op Note (Signed)
[facility] [address] [place], [id number]   ENDOSCOPY PROCEDURE REPORT  PATIENT: Doris Tyler, Doris Tyler  MR#: [id number] BIRTHDATE: 01/24/1990 , 24  yrs. old GENDER: female ENDOSCOPIST: R.  Roetta SessionsMichael Mande Auvil, MD FACP FACG REFERRED BY:  Simone CuriaStephen Luking, M.D. PROCEDURE DATE:  01/23/2015 PROCEDURE:  EGD w/ biopsy INDICATIONS:  Dyspepsia.  Recent improvement with a course of probiotic therapy MEDICATIONS: Versed 5 mg IV and Demerol 125 mg IV in divided doses. Zofran 4 mg IV.  Xylocaine gel poorly. ASA CLASS:      Class II  CONSENT: The risks, benefits, limitations, alternatives and imponderables have been discussed.  The potential for biopsy, esophogeal dilation, etc. have also been reviewed.  Questions have been answered.  All parties agreeable.  Please see the history and physical in the medical record for more information.  DESCRIPTION OF PROCEDURE: After the risks benefits and alternatives of the procedure were thoroughly explained, informed consent was obtained.  The EG-[id number] ([id number] endoscope was introduced through the mouth and advanced to the second portion of the duodenum , limited by Without limitations. The instrument was slowly withdrawn as the mucosa was fully examined. Estimated blood loss is zero unless otherwise noted in this procedure report.    Normal-appearing tubular esophagus.  Stomach empty.  Linear erythema of the antrum and greater curvature of uncertain significance.  No ulcer or infiltrating process.  Patent pylorus.  Normal-appearing first and second portion of the duodenum.  Subsequently, biopsies of the duodenum and gastric mucosa were taken to screen for H pylori infection, eosinophilic gastroenteritis as well as for sprue.  Retroflexed views revealed as previously described.     The scope was then withdrawn from the patient and the procedure completed.  COMPLICATIONS: There were no immediate complications.  ENDOSCOPIC  IMPRESSION: Subtly abnormal gastric mucosa of uncertain significance. Status post gastric and duodenal biopsy as described above  RECOMMENDATIONS: Continue Align daily. Follow up on pathology.  REPEAT EXAM:  eSigned:  R. Roetta SessionsMichael Tamira Ryland, MD Jerrel IvoryFACP [facility] 01/23/2015 10:41 AM    CC:  CPT CODES: ICD CODES:  The ICD and CPT codes recommended by this software are interpretations from the data that the clinical staff has captured with the software.  The verification of the translation of this report to the ICD and CPT codes and modifiers is the sole responsibility of the health care institution and practicing physician where this report was generated.  [employer] will not be held responsible for the validity of the ICD and CPT codes included on this report.  AMA assumes no liability for data contained or not contained herein. CPT is a Publishing rights managerregistered trademark of the [employer].  PATIENT NAME:  Doris Tyler, Doris Tyler MR#: [id number]

## 2015-01-23 NOTE — Discharge Instructions (Signed)
EGD Discharge instructions Please read the instructions outlined below and refer to this sheet in the next few weeks. These discharge instructions provide you with general information on caring for yourself after you leave the hospital. Your doctor may also give you specific instructions. While your treatment has been planned according to the most current medical practices available, unavoidable complications occasionally occur. If you have any problems or questions after discharge, please call your doctor. ACTIVITY  You may resume your regular activity but move at a slower pace for the next 24 hours.   Take frequent rest periods for the next 24 hours.   Walking will help expel (get rid of) the air and reduce the bloated feeling in your abdomen.   No driving for 24 hours (because of the anesthesia (medicine) used during the test).   You may shower.   Do not sign any important legal documents or operate any machinery for 24 hours (because of the anesthesia used during the test).  NUTRITION  Drink plenty of fluids.   You may resume your normal diet.   Begin with a light meal and progress to your normal diet.   Avoid alcoholic beverages for 24 hours or as instructed by your caregiver.  MEDICATIONS  You may resume your normal medications unless your caregiver tells you otherwise.  WHAT YOU CAN EXPECT TODAY  You may experience abdominal discomfort such as a feeling of fullness or gas pains.  FOLLOW-UP  Your doctor will discuss the results of your test with you.  SEEK IMMEDIATE MEDICAL ATTENTION IF ANY OF THE FOLLOWING OCCUR:  Excessive nausea (feeling sick to your stomach) and/or vomiting.   Severe abdominal pain and distention (swelling).   Trouble swallowing.   Temperature over 101 F (37.8 C).   Rectal bleeding or vomiting of blood.     Continue align probiotic daily  Further recommendations to follow pending review of pathology report

## 2015-01-27 ENCOUNTER — Encounter: Payer: Self-pay | Admitting: Internal Medicine

## 2015-01-28 ENCOUNTER — Encounter (HOSPITAL_COMMUNITY): Payer: Self-pay | Admitting: Internal Medicine

## 2015-03-05 ENCOUNTER — Encounter: Payer: Self-pay | Admitting: Gastroenterology

## 2015-03-05 ENCOUNTER — Ambulatory Visit (INDEPENDENT_AMBULATORY_CARE_PROVIDER_SITE_OTHER): Payer: 59 | Admitting: Gastroenterology

## 2015-03-05 VITALS — BP 114/70 | HR 79 | Temp 97.3°F | Ht 66.0 in | Wt 137.2 lb

## 2015-03-05 DIAGNOSIS — R1013 Epigastric pain: Secondary | ICD-10-CM | POA: Diagnosis not present

## 2015-03-05 NOTE — Patient Instructions (Signed)
1. Please call if recurrent symptoms.  2. Continue Align as needed.

## 2015-03-05 NOTE — Progress Notes (Signed)
      Primary Care Physician: Lubertha South, MD  Primary Gastroenterologist:  Roetta Sessions, MD   Chief Complaint  Patient presents with  . Follow-up    HPI: Doris Tyler is a 25 y.o. female here for follow up chronic abdominal pain with intermittent loose stools. Work up negative for gallbladder by PCP.  EGD for abdominal pain. EGD showed reactive gastropathy. Bx negative for Celiac disease, H.pylori. Pantoprazole for about four weeks. No aspirin powders in one year. Very healthy eater. Exercises regularly.  Since her procedure, she stopped eating pork. She was consuming bacon and sausage at her employment. States she's feeling much better. Has had no abdominal pain, bowel movements are regular. She also feels the Karn Pickler is helping tremendously. Denies blood in the stool or melena. No heartburn.   Current Outpatient Prescriptions  Medication Sig Dispense Refill  . megestrol (MEGACE) 40 MG tablet TAKE 1 TABLET EVERY DAY 30 tablet 11  . Probiotic Product (ALIGN PO) Take by mouth daily.     No current facility-administered medications for this visit.    Allergies as of 03/05/2015  . (No Known Allergies)    ROS:  General: Negative for anorexia, weight loss, fever, chills, fatigue, weakness. ENT: Negative for hoarseness, difficulty swallowing , nasal congestion. CV: Negative for chest pain, angina, palpitations, dyspnea on exertion, peripheral edema.  Respiratory: Negative for dyspnea at rest, dyspnea on exertion, cough, sputum, wheezing.  GI: See history of present illness. GU:  Negative for dysuria, hematuria, urinary incontinence, urinary frequency, nocturnal urination.  Endo: Negative for unusual weight change.    Physical Examination:   BP 114/70 mmHg  Pulse 79  Temp(Src) 97.3 F (36.3 C)  Ht  (1.676 m)  Wt 137 lb 3.2 oz (62.234 kg)  BMI 22.16 kg/m2  LMP 12/31/2014 (Approximate)  General: Well-nourished, well-developed in no acute distress.  Eyes: No  icterus. Mouth: Oropharyngeal mucosa moist and pink , no lesions erythema or exudate. Lungs: Clear to auscultation bilaterally.  Heart: Regular rate and rhythm, no murmurs rubs or gallops.  Abdomen: Bowel sounds are normal, nontender, nondistended, no hepatosplenomegaly or masses, no abdominal bruits or hernia , no rebound or guarding.   Extremities: No lower extremity edema. No clubbing or deformities. Neuro: Alert and oriented x 4   Skin: Warm and dry, no jaundice.   Psych: Alert and cooperative, normal mood and affect.

## 2015-03-05 NOTE — Assessment & Plan Note (Signed)
Doing very well with dietary changes and probiotic therapy. Denies further abdominal pain. Bowel movements are more regular. She had reactive gastropathy on her biopsies. She treated with PPI for about 4 weeks. If she has recurrent upper abdominal issues, would consider PPI for 8 weeks but otherwise we will continue to monitor for now. Return to the office as needed.

## 2015-03-05 NOTE — Progress Notes (Signed)
cc'ed to pcp °

## 2015-04-08 ENCOUNTER — Ambulatory Visit (INDEPENDENT_AMBULATORY_CARE_PROVIDER_SITE_OTHER): Payer: 59 | Admitting: Family Medicine

## 2015-04-08 ENCOUNTER — Encounter: Payer: Self-pay | Admitting: Family Medicine

## 2015-04-08 VITALS — BP 112/74 | Temp 98.8°F | Ht 67.0 in | Wt 138.0 lb

## 2015-04-08 DIAGNOSIS — J310 Chronic rhinitis: Secondary | ICD-10-CM

## 2015-04-08 DIAGNOSIS — J329 Chronic sinusitis, unspecified: Secondary | ICD-10-CM | POA: Diagnosis not present

## 2015-04-08 MED ORDER — AMOXICILLIN 500 MG PO CAPS: 500.0000 mg | ORAL_CAPSULE | Freq: Three times a day (TID) | ORAL | Status: DC

## 2015-04-08 NOTE — Progress Notes (Signed)
   Subjective:    Patient ID: Doris Tyler, female    DOB: 07/22/1990, 25 y.o.   MRN: [id number]  Sinusitis This is a new problem. Episode onset: 5 days. Associated symptoms include ear pain and headaches. (Fever, runny nose)    Headache diffuse in nature  Frontal heafdache study in nature worse with change of position, positive yellow nasal discharge  Ear pain off and on  ibu prn ,    Pos   Review of Systems  HENT: Positive for ear pain.   Neurological: Positive for headaches.       Objective:   Physical Exam  Alert, mild malaise. Hydration good Vitals stable. frontal/ maxillary tenderness evident positive nasal congestion. pharynx normal neck supple  lungs clear/no crackles or wheezes. heart regular in rhythm       Assessment & Plan:  Impression rhinosinusitis likely post viral, discussed with patient. plan antibiotics prescribed. Questions answered. Symptomatic care discussed. warning signs discussed. WSL

## 2015-05-10 ENCOUNTER — Telehealth: Payer: Self-pay | Admitting: Obstetrics & Gynecology

## 2015-05-10 MED ORDER — MEGESTROL ACETATE 40 MG PO TABS: ORAL_TABLET | ORAL | Status: DC

## 2015-08-20 ENCOUNTER — Other Ambulatory Visit: Payer: 59 | Admitting: Obstetrics & Gynecology

## 2015-08-22 ENCOUNTER — Other Ambulatory Visit: Payer: 59 | Admitting: Obstetrics & Gynecology

## 2016-02-07 ENCOUNTER — Encounter: Payer: Self-pay | Admitting: Obstetrics & Gynecology

## 2016-02-07 ENCOUNTER — Ambulatory Visit (INDEPENDENT_AMBULATORY_CARE_PROVIDER_SITE_OTHER): Payer: 59 | Admitting: Obstetrics & Gynecology

## 2016-02-07 ENCOUNTER — Other Ambulatory Visit (HOSPITAL_COMMUNITY)
Admission: RE | Admit: 2016-02-07 | Discharge: 2016-02-07 | Disposition: A | Discharge status: Home / Self Care | Payer: 59 | Source: Ambulatory Visit | Attending: Obstetrics & Gynecology | Admitting: Obstetrics & Gynecology

## 2016-02-07 VITALS — BP 110/70 | HR 80 | Ht 66.0 in | Wt 151.5 lb

## 2016-02-07 DIAGNOSIS — Z113 Encounter for screening for infections with a predominantly sexual mode of transmission: Secondary | ICD-10-CM | POA: Diagnosis present

## 2016-02-07 DIAGNOSIS — Z01419 Encounter for gynecological examination (general) (routine) without abnormal findings: Secondary | ICD-10-CM | POA: Insufficient documentation

## 2016-02-07 MED ORDER — MEGESTROL ACETATE 40 MG PO TABS: ORAL_TABLET | ORAL | 11 refills | Status: DC

## 2016-02-07 NOTE — Addendum Note (Signed)
Addended by: Moss McRESENZO, Demonte Dobratz M on: 02/07/2016 11:54 AM   Modules accepted: Orders

## 2016-02-07 NOTE — Progress Notes (Signed)
Subjective:     Doris Tyler is a 26 y.o. female here for a routine exam.  Patient's last menstrual period was 12/02/2015. No obstetric history on file. Birth Control Method:  Progesterone only BCM: megestrol Menstrual Calendar(currently): minimal periods  Current complaints: none.   Current acute medical issues:  none   Recent Gynecologic History Patient's last menstrual period was 12/02/2015. Last Pap: 2015,  normal Last mammogram: ,    History reviewed. No pertinent past medical history.  Past Surgical History:  Procedure Laterality Date  . ESOPHAGOGASTRODUODENOSCOPY N/A 01/23/2015   Dr. Jena Gauss: reactive gastropathy. no h.pylori  . WISDOM TOOTH EXTRACTION      OB History    No data available      Social History   Social History  . Marital status: Single    Spouse name: N/A  . Number of children: 0  . Years of education: N/A   Occupational History  . [employer]    Social History Main Topics  . Smoking status: Never Smoker  . Smokeless tobacco: Never Used  . Alcohol use 0.0 oz/week     Comment: occ  . Drug use: No  . Sexual activity: Not Currently    Birth control/ protection: Pill   Other Topics Concern  . None   Social History Narrative  . None    Family History  Problem Relation Age of Onset  . Colon cancer Neg Hx   . Inflammatory bowel disease Neg Hx   . Celiac disease Neg Hx      Current Outpatient Prescriptions:  .  megestrol (MEGACE) 40 MG tablet, Take 2 tablets daily, Disp: 60 tablet, Rfl: 11 .  Probiotic Product (ALIGN PO), Take by mouth daily., Disp: , Rfl:   Review of Systems  Review of Systems  Constitutional: Negative for fever, chills, weight loss, malaise/fatigue and diaphoresis.  HENT: Negative for hearing loss, ear pain, nosebleeds, congestion, sore throat, neck pain, tinnitus and ear discharge.   Eyes: Negative for blurred vision, double vision, photophobia, pain, discharge and redness.  Respiratory: Negative for  cough, hemoptysis, sputum production, shortness of breath, wheezing and stridor.   Cardiovascular: Negative for chest pain, palpitations, orthopnea, claudication, leg swelling and PND.  Gastrointestinal: negative for abdominal pain. Negative for heartburn, nausea, vomiting, diarrhea, constipation, blood in stool and melena.  Genitourinary: Negative for dysuria, urgency, frequency, hematuria and flank pain.  Musculoskeletal: Negative for myalgias, back pain, joint pain and falls.  Skin: Negative for itching and rash.  Neurological: Negative for dizziness, tingling, tremors, sensory change, speech change, focal weakness, seizures, loss of consciousness, weakness and headaches.  Endo/Heme/Allergies: Negative for environmental allergies and polydipsia. Does not bruise/bleed easily.  Psychiatric/Behavioral: Negative for depression, suicidal ideas, hallucinations, memory loss and substance abuse. The patient is not nervous/anxious and does not have insomnia.        Objective:  Blood pressure 110/70, pulse 80, height 5\' 6"  (1.676 m), weight 151 lb 8 oz (68.7 kg), last menstrual period 12/02/2015.   Physical Exam  Vitals reviewed. Constitutional: She is oriented to person, place, and time. She appears well-developed and well-nourished.  HENT:  Head: Normocephalic and atraumatic.        Right Ear: External ear normal.  Left Ear: External ear normal.  Nose: Nose normal.  Mouth/Throat: Oropharynx is clear and moist.  Eyes: Conjunctivae and EOM are normal. Pupils are equal, round, and reactive to light. Right eye exhibits no discharge. Left eye exhibits no discharge. No scleral icterus.  Neck: Normal  range of motion. Neck supple. No tracheal deviation present. No thyromegaly present.  Cardiovascular: Normal rate, regular rhythm, normal heart sounds and intact distal pulses.  Exam reveals no gallop and no friction rub.   No murmur heard. Respiratory: Effort normal and breath sounds normal. No  respiratory distress. She has no wheezes. She has no rales. She exhibits no tenderness.  GI: Soft. Bowel sounds are normal. She exhibits no distension and no mass. There is no tenderness. There is no rebound and no guarding.  Genitourinary:  Breasts no masses skin changes or nipple changes bilaterally      Vulva is normal without lesions Vagina is pink moist without discharge Cervix normal in appearance and pap is done Uterus is normal size shape and contour Adnexa is negative with normal sized ovaries   Musculoskeletal: Normal range of motion. She exhibits no edema and no tenderness.  Neurological: She is alert and oriented to person, place, and time. She has normal reflexes. She displays normal reflexes. No cranial nerve deficit. She exhibits normal muscle tone. Coordination normal.  Skin: Skin is warm and dry. No rash noted. No erythema. No pallor.  Psychiatric: She has a normal mood and affect. Her behavior is normal. Judgment and thought content normal.       Medications Ordered at today's visit: No orders of the defined types were placed in this encounter.   Other orders placed at today's visit: No orders of the defined types were placed in this encounter.     Assessment:    Healthy female exam.    Plan:    Contraception: oral progesterone-only contraceptive. Follow up in: 2 years.     No Follow-up on file.

## 2016-02-11 LAB — CYTOLOGY - PAP
Adequacy: ABSENT
CHLAMYDIA, DNA PROBE: NEGATIVE
DIAGNOSIS: NEGATIVE
Neisseria Gonorrhea: NEGATIVE

## 2016-07-20 ENCOUNTER — Telehealth: Payer: Self-pay | Admitting: Family Medicine

## 2016-07-20 NOTE — Telephone Encounter (Signed)
Shot record up front for pick up. Patient notified. 

## 2016-07-20 NOTE — Telephone Encounter (Signed)
Requesting copy of shot record. °

## 2016-12-31 DIAGNOSIS — H52223 Regular astigmatism, bilateral: Secondary | ICD-10-CM | POA: Diagnosis not present

## 2017-02-24 ENCOUNTER — Other Ambulatory Visit: Payer: Self-pay | Admitting: Obstetrics & Gynecology

## 2017-05-12 IMAGING — US US ABDOMEN LIMITED
1 series · 14 of 25 positions shown · non-contrast
Comparison: None.

CLINICAL DATA: Right upper quadrant and epigastric pain for 1 week
with nausea

EXAM:
US ABDOMEN LIMITED - RIGHT UPPER QUADRANT

[Series 1: us abdomen limited · 0.12mm/px · 14 of 44 slices shown]
[im 1/44]
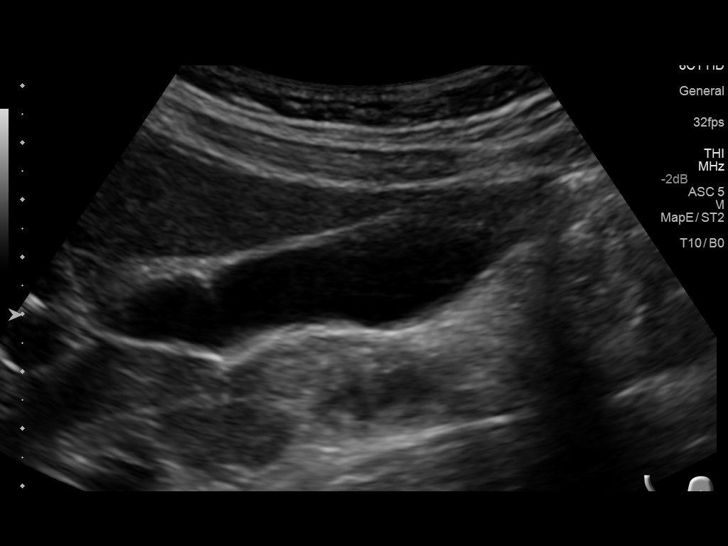
[im 4/44]
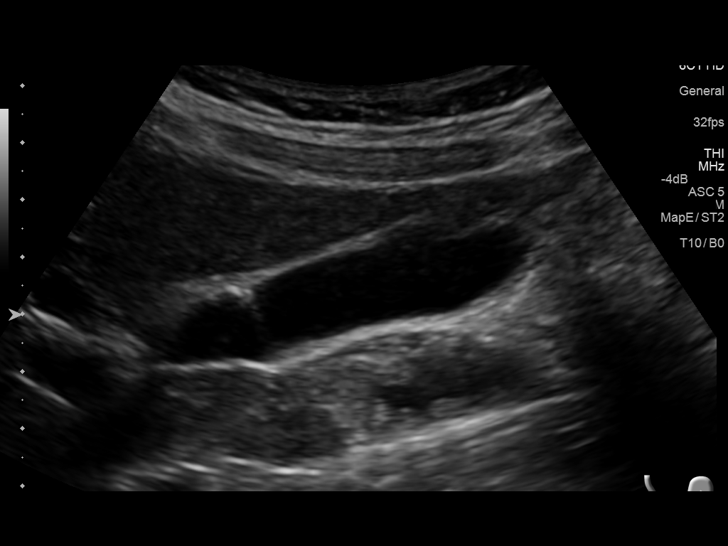
[im 8/44]
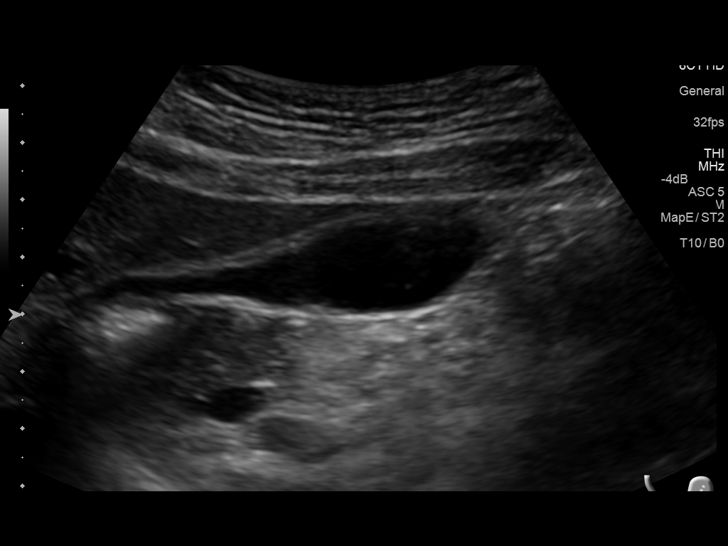
[im 11/44]
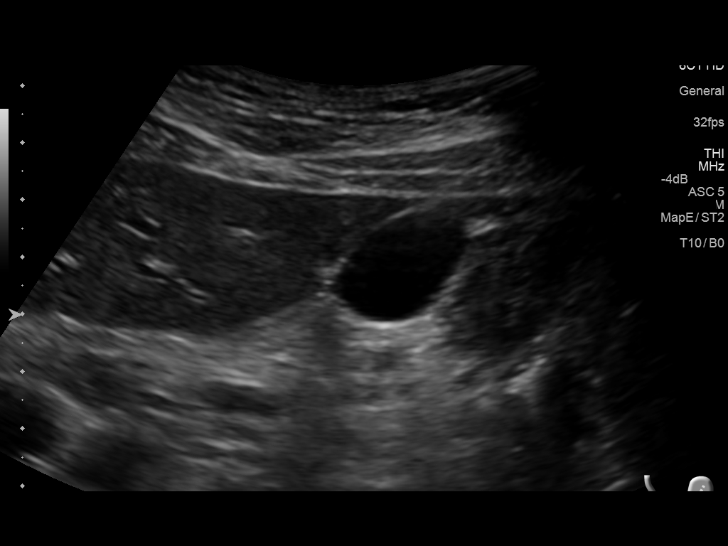
[im 15/44]
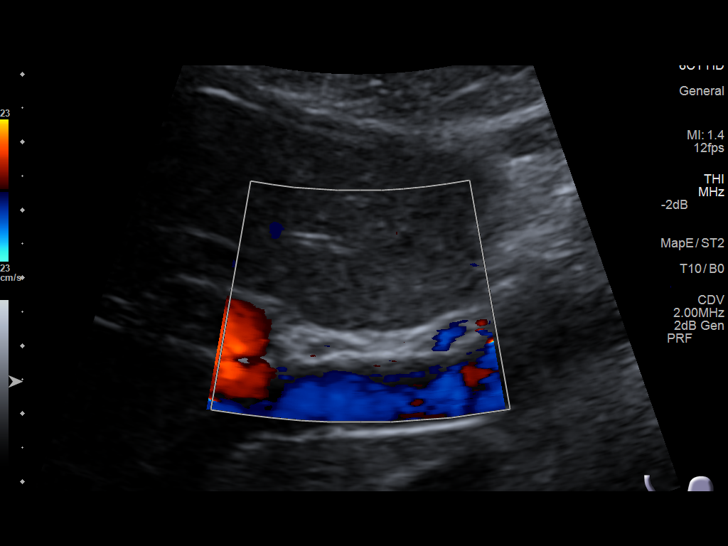
[im 17/44]
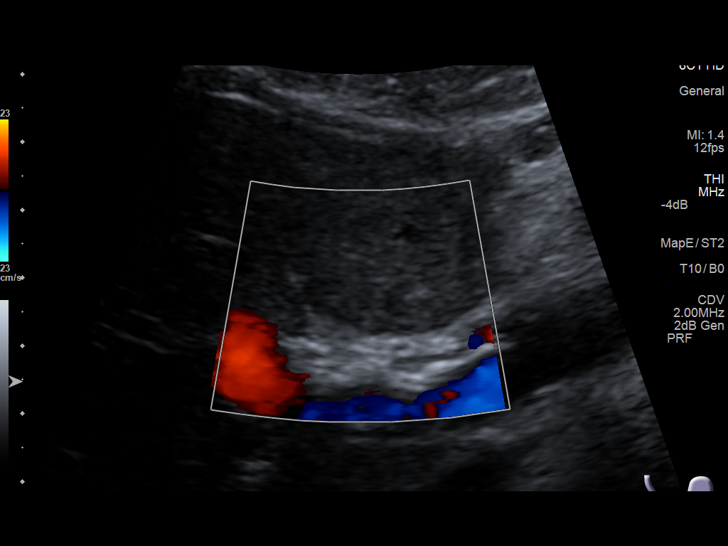
[im 20/44]
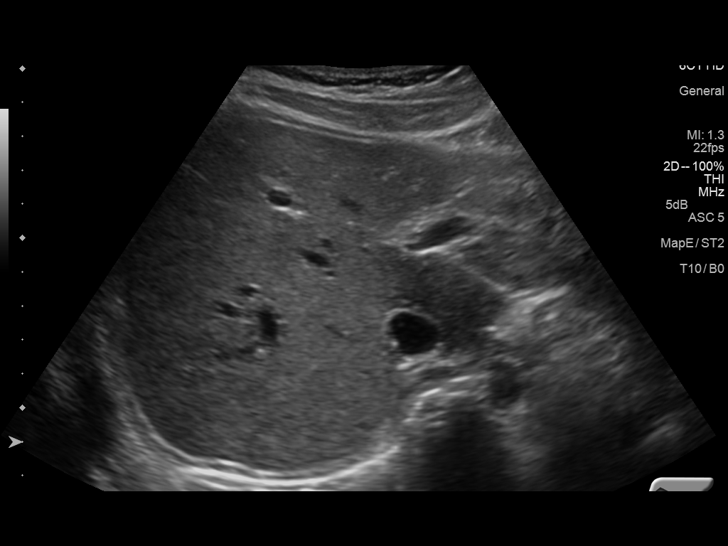
[im 24/44]
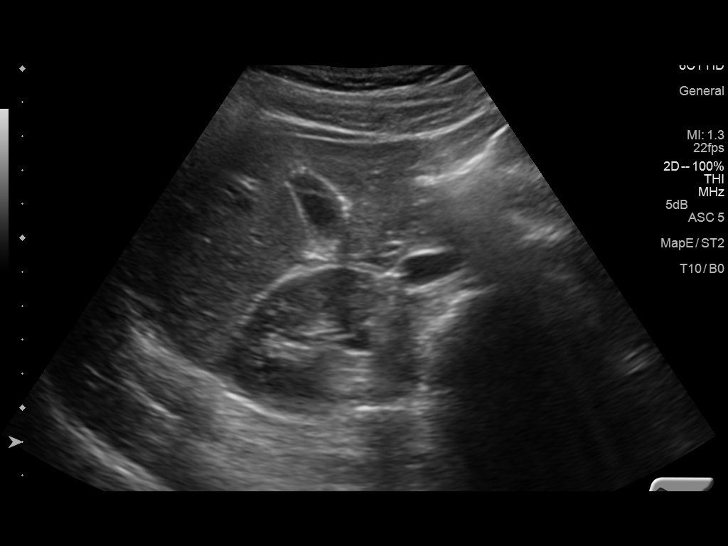
[im 27/44]
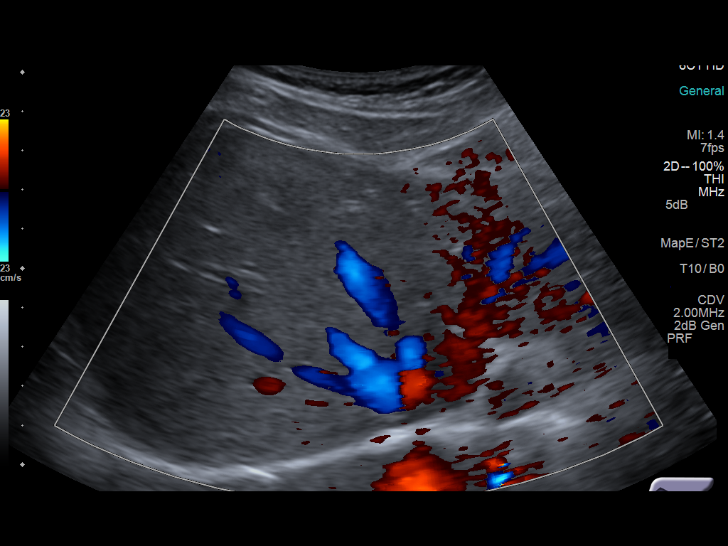
[im 29/44]
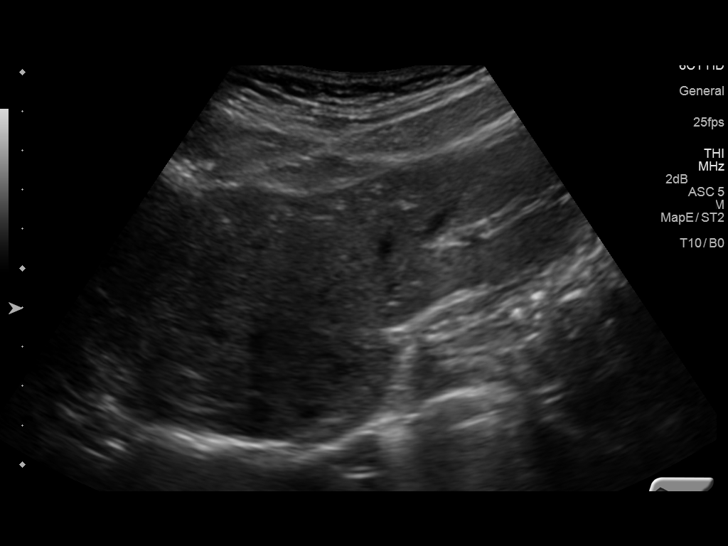
[im 33/44]
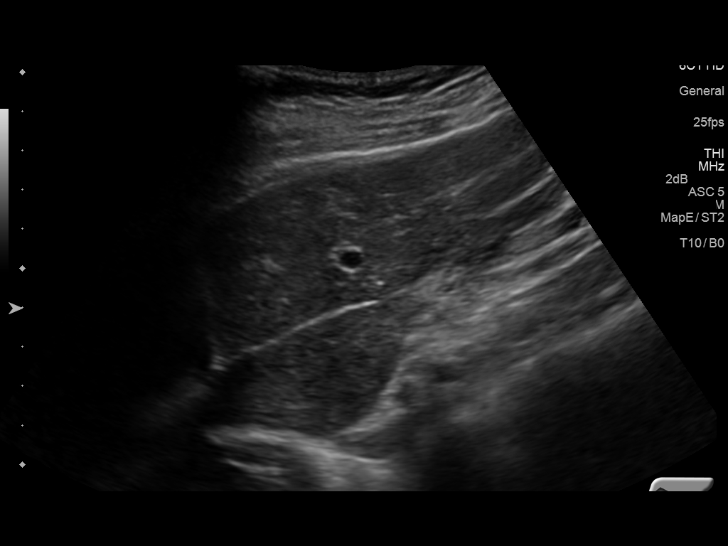
[im 36/44]
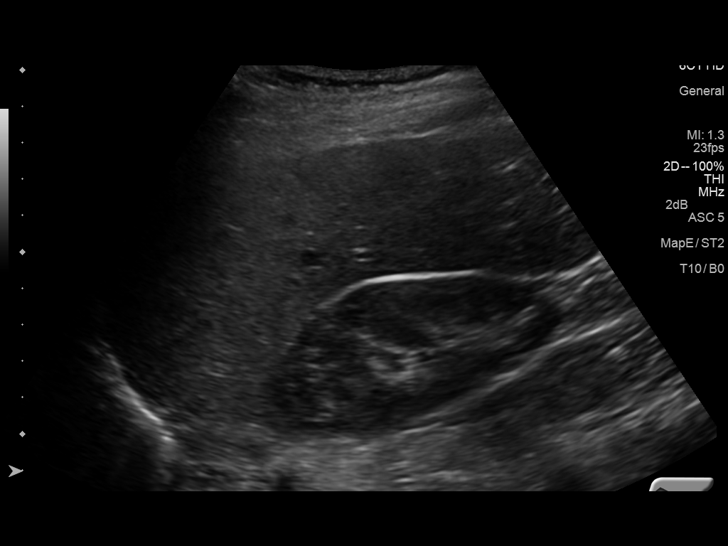
[im 40/44]
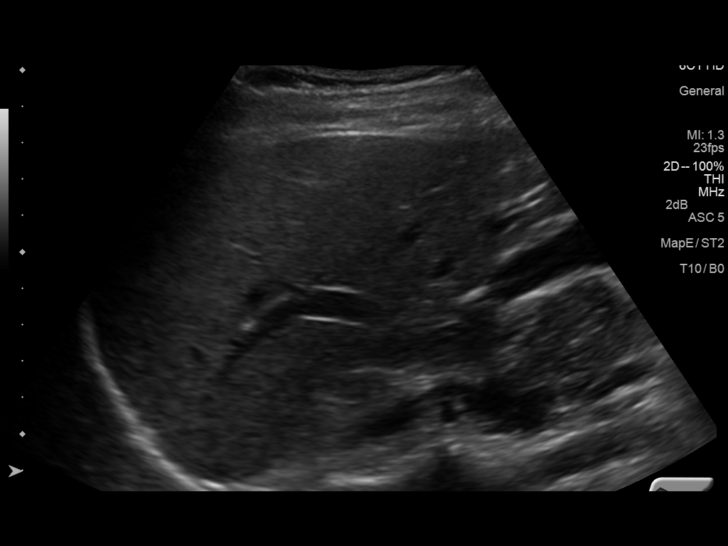
[im 44/44]
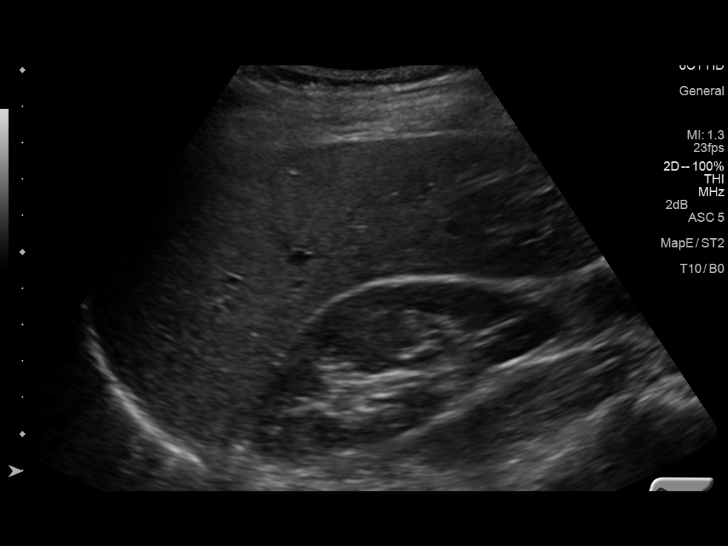

[14 of 25 positions shown; findings below may reference images not displayed]

FINDINGS: Gallbladder:

No gallstones or wall thickening visualized. There is no
pericholecystic fluid. No sonographic Murphy sign noted.

Common bile duct:

Diameter: 2 mm. There is no intrahepatic or extrahepatic biliary
duct dilatation.

Liver:

No focal lesion identified. Within normal limits in parenchymal
echogenicity.
IMPRESSION: Study within normal limits.

## 2017-08-21 ENCOUNTER — Other Ambulatory Visit: Payer: Self-pay | Admitting: Obstetrics & Gynecology

## 2017-11-29 IMAGING — NM NM HEPATO W/GB/PHARM/[PERSON_NAME]
2 series · 12 of 12 positions shown · non-contrast
Comparison: Abdominal ultrasound August 23, 2014

CLINICAL DATA: Two weeks of right upper quadrant abdominal pain
radiating to the back ; touch tenderness in the right upper
quadrant, no nausea or vomiting and discomfort does not appear to be
related to food ingestion. History of gastritis.

EXAM:
NUCLEAR MEDICINE HEPATOBILIARY IMAGING WITH GALLBLADDER EF
TECHNIQUE: Sequential images of the abdomen were obtained [DATE] minutes
following intravenous administration of radiopharmaceutical. After
slow intravenous infusion of 1.23 micrograms Cholecystokinin,
gallbladder ejection fraction was determined.
RADIOPHARMACEUTICALS:  5.2 mCi Cc-QQm Choletec IV

[Series 1: biliary · 3.25mm/px · 6 of 60 frames shown]
[frame 6/60]
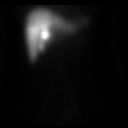
[frame 16/60]
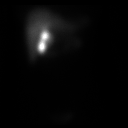
[frame 26/60]
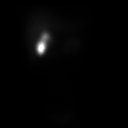
[frame 36/60]
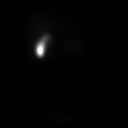
[frame 46/60]
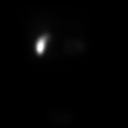
[frame 56/60]
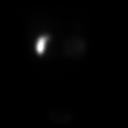

[Series 2: gbef · 3.25mm/px · 6 of 60 frames shown]
[frame 6/60]
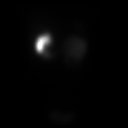
[frame 16/60]
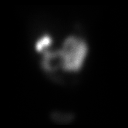
[frame 26/60]
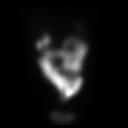
[frame 36/60]
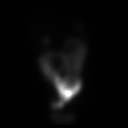
[frame 46/60]
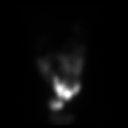
[frame 56/60]
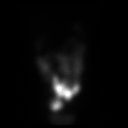

[12 of 12 positions shown; findings below may reference images not displayed]

FINDINGS: There is adequate uptake of the radiopharmaceutical by the liver.
The intrahepatic ducts are visible by 5 minutes and the gallbladder
by 10 minutes. Bowel activity is evident by 15 minutes. Following
CCK injection the 45 minutes gallbladder ejection fraction is 99%..
At 45 min, normal ejection fraction is greater than 40%.
IMPRESSION: Normal hepatobiliary scan with normal gallbladder ejection fraction.

## 2017-12-09 ENCOUNTER — Encounter: Payer: 59 | Admitting: Family Medicine

## 2018-01-06 ENCOUNTER — Other Ambulatory Visit: Payer: Self-pay | Admitting: Obstetrics & Gynecology

## 2018-02-15 ENCOUNTER — Ambulatory Visit (INDEPENDENT_AMBULATORY_CARE_PROVIDER_SITE_OTHER): Payer: 59 | Admitting: Family Medicine

## 2018-02-15 ENCOUNTER — Encounter: Payer: Self-pay | Admitting: Family Medicine

## 2018-02-15 VITALS — BP 110/80 | Ht 67.0 in | Wt 146.0 lb

## 2018-02-15 DIAGNOSIS — Z13220 Encounter for screening for lipoid disorders: Secondary | ICD-10-CM

## 2018-02-15 DIAGNOSIS — Z79899 Other long term (current) drug therapy: Secondary | ICD-10-CM

## 2018-02-15 DIAGNOSIS — R400 Somnolence: Secondary | ICD-10-CM | POA: Diagnosis not present

## 2018-02-15 DIAGNOSIS — R5383 Other fatigue: Secondary | ICD-10-CM

## 2018-02-15 NOTE — Progress Notes (Signed)
   Subjective:    Patient ID: Doris Tyler, female    DOB: 1990/05/26, 28 y.o.   MRN: [id number]  HPI Patient is here today with troubles staying awake.Ongoing for the last few weeks, with the last few days being unbearable. She does work nights.Tired through the day and night. Not on any medications other that birthcontrol.  Patient currently working in admissions and patient placement.  Patient notes extreme overpowering fatigue.  Has rested recent weeks.  Months ago did not have a problem.  Patient notes that her job is complicated.  School challenging to however she does not feel overwhelmed by it.  Used to exercise regularly not so much now.  Patient claims no major stress or anxiety currently  She admits snoring a little bit but no major history of this  Just notes severe exhaustion.  Patient worried that there may be something more serious going on.   Review of Systems No headache, no major weight loss or weight gain, no chest pain no back pain abdominal pain no change in bowel habits complete ROS otherwise negative     Objective:   Physical Exam  Alert and oriented, vitals reviewed and stable, NAD ENT-TM's and ext canals WNL bilat via otoscopic exam Soft palate, tonsils and post pharynx WNL via oropharyngeal exam Neck-symmetric, no masses; thyroid nonpalpable and nontender Pulmonary-no tachypnea or accessory muscle use; Clear without wheezes via auscultation Card--no abnrml murmurs, rhythm reg and rate WNL Carotid pulses symmetric, without bruits       Assessment & Plan:  Impression profound fatigue accompanied by daytime drowsiness.  Patient claims no mental health connection.  No major history consistent with sleep apnea.  Patient does have a night shift which leads to some challenges with sleep disturbance.  Not had blood work recently.  Exercise encouraged.  Sleep hygiene discussed.  Appropriate blood work.  Further recommendations based on  results.  Greater than 50% of this 25 minute face to face visit was spent in counseling and discussion and coordination of care regarding the above diagnosis/diagnosies

## 2018-02-16 DIAGNOSIS — Z79899 Other long term (current) drug therapy: Secondary | ICD-10-CM | POA: Diagnosis not present

## 2018-02-16 DIAGNOSIS — Z13220 Encounter for screening for lipoid disorders: Secondary | ICD-10-CM | POA: Diagnosis not present

## 2018-02-16 DIAGNOSIS — R5383 Other fatigue: Secondary | ICD-10-CM | POA: Diagnosis not present

## 2018-02-17 LAB — HEPATIC FUNCTION PANEL
ALBUMIN: 4.6 g/dL (ref 3.9–5.0)
ALT: 12 IU/L (ref 0–32)
AST: 16 IU/L (ref 0–40)
Alkaline Phosphatase: 58 IU/L (ref 39–117)
BILIRUBIN TOTAL: 0.4 mg/dL (ref 0.0–1.2)
BILIRUBIN, DIRECT: 0.12 mg/dL (ref 0.00–0.40)
TOTAL PROTEIN: 7.2 g/dL (ref 6.0–8.5)

## 2018-02-17 LAB — CBC WITH DIFFERENTIAL/PLATELET
BASOS: 1 %
Basophils Absolute: 0 10*3/uL (ref 0.0–0.2)
EOS (ABSOLUTE): 0 10*3/uL (ref 0.0–0.4)
Eos: 1 %
HEMOGLOBIN: 13.2 g/dL (ref 11.1–15.9)
Hematocrit: 38.7 % (ref 34.0–46.6)
IMMATURE GRANS (ABS): 0 10*3/uL (ref 0.0–0.1)
Immature Granulocytes: 0 %
LYMPHS ABS: 3.6 10*3/uL — AB (ref 0.7–3.1)
LYMPHS: 68 %
MCH: 31.8 pg (ref 26.6–33.0)
MCHC: 34.1 g/dL (ref 31.5–35.7)
MCV: 93 fL (ref 79–97)
MONOCYTES: 6 %
Monocytes Absolute: 0.3 10*3/uL (ref 0.1–0.9)
NEUTROS ABS: 1.3 10*3/uL — AB (ref 1.4–7.0)
Neutrophils: 24 %
Platelets: 261 10*3/uL (ref 150–450)
RBC: 4.15 x10E6/uL (ref 3.77–5.28)
RDW: 12.1 % (ref 11.7–15.4)
WBC: 5.3 10*3/uL (ref 3.4–10.8)

## 2018-02-17 LAB — LIPID PANEL
CHOL/HDL RATIO: 3.1 ratio (ref 0.0–4.4)
Cholesterol, Total: 179 mg/dL (ref 100–199)
HDL: 58 mg/dL (ref >39)
LDL Calculated: 113 mg/dL — ABNORMAL HIGH (ref 0–99)
TRIGLYCERIDES: 42 mg/dL (ref 0–149)
VLDL Cholesterol Cal: 8 mg/dL (ref 5–40)

## 2018-02-17 LAB — BASIC METABOLIC PANEL
BUN / CREAT RATIO: 13 (ref 9–23)
BUN: 12 mg/dL (ref 6–20)
CALCIUM: 9.3 mg/dL (ref 8.7–10.2)
CO2: 18 mmol/L — ABNORMAL LOW (ref 20–29)
CREATININE: 0.94 mg/dL (ref 0.57–1.00)
Chloride: 106 mmol/L (ref 96–106)
GFR calc non Af Amer: 83 mL/min/{1.73_m2} (ref >59)
GFR, EST AFRICAN AMERICAN: 95 mL/min/{1.73_m2} (ref >59)
Glucose: 93 mg/dL (ref 65–99)
Potassium: 4.1 mmol/L (ref 3.5–5.2)
Sodium: 139 mmol/L (ref 134–144)

## 2018-02-17 LAB — TSH: TSH: 2.41 u[IU]/mL (ref 0.450–4.500)

## 2018-02-22 ENCOUNTER — Encounter: Payer: Self-pay | Admitting: Family Medicine

## 2018-03-14 DIAGNOSIS — G43109 Migraine with aura, not intractable, without status migrainosus: Secondary | ICD-10-CM | POA: Diagnosis not present

## 2018-03-14 DIAGNOSIS — H5201 Hypermetropia, right eye: Secondary | ICD-10-CM | POA: Diagnosis not present

## 2018-03-14 DIAGNOSIS — H52223 Regular astigmatism, bilateral: Secondary | ICD-10-CM | POA: Diagnosis not present

## 2018-03-15 ENCOUNTER — Ambulatory Visit (INDEPENDENT_AMBULATORY_CARE_PROVIDER_SITE_OTHER): Payer: 59 | Admitting: Adult Health

## 2018-03-15 ENCOUNTER — Encounter: Payer: Self-pay | Admitting: Adult Health

## 2018-03-15 VITALS — BP 115/69 | HR 92 | Ht 66.0 in | Wt 148.0 lb

## 2018-03-15 DIAGNOSIS — Z01419 Encounter for gynecological examination (general) (routine) without abnormal findings: Secondary | ICD-10-CM

## 2018-03-15 DIAGNOSIS — Z3202 Encounter for pregnancy test, result negative: Secondary | ICD-10-CM | POA: Diagnosis not present

## 2018-03-15 DIAGNOSIS — F329 Major depressive disorder, single episode, unspecified: Secondary | ICD-10-CM

## 2018-03-15 DIAGNOSIS — F32A Depression, unspecified: Secondary | ICD-10-CM

## 2018-03-15 LAB — POCT URINE PREGNANCY: PREG TEST UR: NEGATIVE

## 2018-03-15 MED ORDER — ESCITALOPRAM OXALATE 10 MG PO TABS: 10.0000 mg | ORAL_TABLET | Freq: Every day | ORAL | 6 refills | Status: DC

## 2018-03-15 NOTE — Progress Notes (Signed)
Patient ID: Doris Tyler, female   DOB: Jul 23, 1990, 28 y.o.   MRN: [id number] History of Present Illness:  Doris Tyler is a 28 year old black female, single, G1P0, in for well woman gyn exam,she had a normal pap with negative pap 02/07/16. She works for [employer] in [employer] in [place].  PCP is Dr Lubertha South.  Current Medications, Allergies, Past Medical History, Past Surgical History, Family History and Social History were reviewed in [name].     Review of Systems: Patient denies any headaches, hearing loss,  blurred vision, shortness of breath, chest pain, abdominal pain, problems with bowel movements, urination, or intercourse(not currently active). No joint pain or mood swings. Not sleeping well, has a lot going on,feels tired at times and depressed.  She uses megace to control periods.   Physical Exam:BP 115/69 (BP Location: Left Arm, Patient Position: Sitting, Cuff Size: Normal)   Pulse 92   Ht 5\' 6"  (1.676 m)   Wt 148 lb (67.1 kg)   LMP 01/31/2018 (Approximate) Comment: takes Megace  BMI 23.89 kg/m UPT is negative. General:  Well developed, well nourished, no acute distress Skin:  Warm and dry Neck:  Midline trachea, normal thyroid, good ROM, no lymphadenopathy Lungs; Clear to auscultation bilaterally Breast:  No dominant palpable mass, retraction, or nipple discharge Cardiovascular: Regular rate and rhythm Abdomen:  Soft, non tender, no hepatosplenomegaly Pelvic:  External genitalia is normal in appearance, no lesions.  The vagina is normal in appearance. Urethra has no lesions or masses. The cervix is nulliparous. Uterus is felt to be normal size, shape, and contour.  No adnexal masses or tenderness noted.Bladder is non tender, no masses felt. Extremities/musculoskeletal:  No swelling or varicosities noted, no clubbing or cyanosis Psych:  No mood changes, alert and cooperative,seems happy Fall risk is low. PHQ 9 score is 11 denies  being suicidal but depressed, mind races, is open to meds, will try lexapro. Also try exercise, prayer and meditation.  Examination chaperoned by Federico Flake CMA.   Impression: 1. Encounter for gynecological examination with Papanicolaou smear of cervix   2. Pregnancy examination or test, negative result   3. Depression, unspecified depression type       Plan:  Meds ordered this encounter  Medications  . escitalopram (LEXAPRO) 10 MG tablet    Sig: Take 1 tablet (10 mg total) by mouth daily.    Dispense:  30 tablet    Refill:  6    Order Specific Question:   Supervising Provider    Answer:   Duane Lope H [[id number]]  F/U in 8 weeks on lexapro Physical and pap in 1 year

## 2018-04-29 ENCOUNTER — Other Ambulatory Visit: Payer: Self-pay | Admitting: Obstetrics & Gynecology

## 2018-05-06 MED FILL — ESCITALOPRAM 10 MG TABLET: 10 | 30 days supply | Qty: 30 | Fill #0

## 2018-05-09 ENCOUNTER — Telehealth: Payer: Self-pay | Admitting: *Deleted

## 2018-05-09 NOTE — Telephone Encounter (Signed)
Pt's visit tomorrow is for follow up on Lexapro. Advised can do this by telephone or webex. Pt requests televisit. Advised to expect a call close to her appt. Time. Pt voiced understanding. JSY

## 2018-05-10 ENCOUNTER — Other Ambulatory Visit: Payer: Self-pay

## 2018-05-10 ENCOUNTER — Ambulatory Visit (INDEPENDENT_AMBULATORY_CARE_PROVIDER_SITE_OTHER): Payer: 59 | Admitting: Adult Health

## 2018-05-10 ENCOUNTER — Encounter: Payer: Self-pay | Admitting: Adult Health

## 2018-05-10 DIAGNOSIS — F329 Major depressive disorder, single episode, unspecified: Secondary | ICD-10-CM | POA: Diagnosis not present

## 2018-05-10 DIAGNOSIS — F32A Depression, unspecified: Secondary | ICD-10-CM

## 2018-05-10 MED ORDER — ESCITALOPRAM OXALATE 10 MG PO TABS: 10.0000 mg | ORAL_TABLET | Freq: Every day | ORAL | 2 refills | Status: DC

## 2018-05-10 NOTE — Progress Notes (Addendum)
Patient ID: Doris Tyler, female   DOB: 04-07-1990, 28 y.o.   MRN: [id number]   TELEHEALTH VIRTUAL GYNECOLOGY VISIT ENCOUNTER NOTE  I connected with Doris Tyler on 05/10/18 at  8:45 AM EDT by telephone at home and verified that I am speaking with the correct person using two identifiers.   I discussed the limitations, risks, security and privacy concerns of performing an evaluation and management service by telephone and the availability of in person appointments. I also discussed with the patient that there may be a patient responsible charge related to this service. The patient expressed understanding and agreed to proceed.   History:  Doris Tyler is a 28 y.o. G75P0010 female being evaluated today for depression, she started lexapro 10 mg in February and is much better she says, she says more alert and focused and sleeping better . She denies any depression at present or other concerns.    PCP is Lubertha South.   History reviewed. No pertinent past medical history. Past Surgical History:  Procedure Laterality Date  . ESOPHAGOGASTRODUODENOSCOPY N/A 01/23/2015   Dr. Jena Gauss: reactive gastropathy. no h.pylori  . WISDOM TOOTH EXTRACTION     The following portions of the patient's history were reviewed and updated as appropriate: allergies, current medications, past family history, past medical history, past social history, past surgical history and problem list.   Health Maintenance:  Normal pap  on 02/07/16  Review of Systems:  Pertinent items noted in HPI and remainder of comprehensive ROS otherwise negative.  Physical Exam:   General:  Alert, oriented and cooperative.   Mental Status: Normal mood and affect perceived. Normal judgment and thought content.  Physical exam deferred due to nature of the encounter PHQ 9 score 0 today, was 11 on 03/15/2018.  Will continue Lexapro 10 mg 1 daily.  Labs and Imaging No results found for this or any previous visit (from the past 336  hour(s)). No results found.    Assessment and Plan:     1. Depression, unspecified depression type -will continue lexapro Meds ordered this encounter  Medications  . escitalopram (LEXAPRO) 10 MG tablet    Sig: Take 1 tablet (10 mg total) by mouth daily.    Dispense:  90 tablet    Refill:  2    Order Specific Question:   Supervising Provider    Answer:   Duane Lope H [[id number]]  F/U in 3 months with me  Call sooner if needed or anything changes.       I discussed the assessment and treatment plan with the patient. The patient was provided an opportunity to ask questions and all were answered. The patient agreed with the plan and demonstrated an understanding of the instructions.   The patient was advised to call back or seek an in-person evaluation/go to the ED if the symptoms worsen or if the condition fails to improve as anticipated.  I provided  5 minutes of non-face-to-face time during this encounter.   Cyril Mourning, NP [facility] [employer], [facility]

## 2018-06-15 MED FILL — ESCITALOPRAM 10 MG TABLET: 10 | 30 days supply | Qty: 30 | Fill #1

## 2018-07-19 ENCOUNTER — Other Ambulatory Visit: Payer: Self-pay | Admitting: Obstetrics & Gynecology

## 2018-07-28 MED FILL — ESCITALOPRAM 10 MG TABLET: 10 | 30 days supply | Qty: 30 | Fill #2

## 2018-09-14 MED FILL — ESCITALOPRAM 10 MG TABLET: 10 | 30 days supply | Qty: 30 | Fill #3

## 2018-09-23 MED FILL — ESCITALOPRAM 10 MG TABLET: 10 | 30 days supply | Qty: 30 | Fill #3

## 2018-11-07 MED FILL — ESCITALOPRAM 10 MG TABLET: 10 | 30 days supply | Qty: 30 | Fill #4

## 2018-11-14 ENCOUNTER — Ambulatory Visit (INDEPENDENT_AMBULATORY_CARE_PROVIDER_SITE_OTHER): Payer: Self-pay | Admitting: Family Medicine

## 2018-11-14 ENCOUNTER — Other Ambulatory Visit: Payer: Self-pay

## 2018-11-14 ENCOUNTER — Encounter: Payer: Self-pay | Admitting: Family Medicine

## 2018-11-14 VITALS — BP 120/84 | Temp 97.7°F | Wt 146.6 lb

## 2018-11-14 DIAGNOSIS — L03011 Cellulitis of right finger: Secondary | ICD-10-CM

## 2018-11-14 MED ORDER — CEPHALEXIN 500 MG PO CAPS: ORAL_CAPSULE | ORAL | 0 refills | Status: DC

## 2018-11-14 NOTE — Progress Notes (Signed)
   Subjective:    Patient ID: Doris Tyler, female    DOB: 06-09-1990, 28 y.o.   MRN: [id number]  HPI Pt is having swelling in right middle finger. Painful and does have some yellow-greenish infection on side of finger nail. Pt cut a hangnail about a week ago and a few days after that , the finger became sore. Pt has tried Ibuprofen.    Review of Systems No headache, no major weight loss or weight gain, no chest pain no back pain abdominal pain no change in bowel habits complete ROS otherwise negative     Objective:   Physical Exam Alert vitals stable, NAD. Blood pressure good on repeat. HEENT normal. Lungs clear. Heart regular rate and rhythm. With the edges of a beveled needle incision was made and discharge expressed from the paronychial finger infection      Assessment & Plan:  Impression infected finger plan antibiotics wound care discussed frequent dressing changes expect gradual resolution

## 2018-12-26 MED FILL — ESCITALOPRAM 10 MG TABLET: 10 | 90 days supply | Qty: 90 | Fill #0

## 2019-01-24 ENCOUNTER — Other Ambulatory Visit: Payer: Self-pay

## 2019-01-25 ENCOUNTER — Other Ambulatory Visit: Payer: Self-pay | Admitting: Obstetrics & Gynecology

## 2019-01-25 ENCOUNTER — Other Ambulatory Visit: Payer: Self-pay

## 2019-01-25 ENCOUNTER — Ambulatory Visit (INDEPENDENT_AMBULATORY_CARE_PROVIDER_SITE_OTHER): Payer: Self-pay | Admitting: Adult Health

## 2019-01-25 ENCOUNTER — Ambulatory Visit: Payer: Self-pay | Attending: Internal Medicine

## 2019-01-25 ENCOUNTER — Encounter: Payer: Self-pay | Admitting: Adult Health

## 2019-01-25 VITALS — BP 114/75 | HR 96 | Ht 67.0 in | Wt 148.8 lb

## 2019-01-25 DIAGNOSIS — Z20822 Contact with and (suspected) exposure to covid-19: Secondary | ICD-10-CM | POA: Insufficient documentation

## 2019-01-25 DIAGNOSIS — Z3202 Encounter for pregnancy test, result negative: Secondary | ICD-10-CM

## 2019-01-25 DIAGNOSIS — N926 Irregular menstruation, unspecified: Secondary | ICD-10-CM

## 2019-01-25 LAB — POCT URINE PREGNANCY: Preg Test, Ur: NEGATIVE

## 2019-01-25 NOTE — Progress Notes (Signed)
  Subjective:     Patient ID: Doris Tyler, female   DOB: October 15, 1990, 29 y.o.   MRN: [id number]  HPI Doris Tyler is a 29 year old black female, single, G1P0010, in for UPT, has had irregular periods and had negative HPT, but has had slight headache and increased appetite and wondered if pregnant. She take megace to keep periods light, but has stopped that. PCP is Dr Lubertha South.  Review of Systems Had period in November for 5 days was dark, no period in December then started bleeding some 01/21/19 Has had slight headache  Has increased appetite  Reviewed past medical,surgical, social and family history. Reviewed medications and allergies.     Objective:   Physical Exam BP 114/75 (BP Location: Left Arm, Patient Position: Sitting, Cuff Size: Normal)   Pulse 96   Ht 5\' 7"  (1.702 m)   Wt 148 lb 12.8 oz (67.5 kg)   LMP 01/21/2019 (Exact Date)   BMI 23.31 kg/m   .UPT is negative Fall risk is low PHQ 2 score is  0. Skin warm and dry. Neck: mid line trachea, normal thyroid, good ROM, no lymphadenopathy noted. Lungs: clear to ausculation bilaterally. Cardiovascular: regular rate and rhythm.    Assessment:     1. Urine pregnancy test negative   2. Irregular periods Check QHCG  Keep period log Stay off megace for now Use condoms if has sex    Plan:     Return in about 6 weeks for pap and physical

## 2019-01-26 LAB — BETA HCG QUANT (REF LAB): hCG Quant: <1 m[IU]/mL

## 2019-01-26 LAB — NOVEL CORONAVIRUS, NAA: SARS-CoV-2, NAA: NOT DETECTED

## 2019-01-27 ENCOUNTER — Telehealth: Payer: Self-pay | Admitting: Family Medicine

## 2019-01-27 NOTE — Telephone Encounter (Signed)
Patient is calling to receive her Covid test results. Patient expressed understanding.

## 2019-02-20 ENCOUNTER — Encounter: Payer: Self-pay | Admitting: Family Medicine

## 2019-03-07 ENCOUNTER — Ambulatory Visit: Payer: Medicaid Other | Attending: Internal Medicine

## 2019-03-07 ENCOUNTER — Other Ambulatory Visit: Payer: Self-pay

## 2019-03-07 DIAGNOSIS — Z20822 Contact with and (suspected) exposure to covid-19: Secondary | ICD-10-CM

## 2019-03-08 ENCOUNTER — Other Ambulatory Visit: Payer: Self-pay | Admitting: Adult Health

## 2019-03-08 LAB — NOVEL CORONAVIRUS, NAA: SARS-CoV-2, NAA: NOT DETECTED

## 2019-04-29 ENCOUNTER — Ambulatory Visit: Payer: Medicaid Other | Attending: Internal Medicine

## 2019-04-29 DIAGNOSIS — Z23 Encounter for immunization: Secondary | ICD-10-CM

## 2019-04-29 NOTE — Progress Notes (Signed)
   Covid-19 Vaccination Clinic  Name:  Doris Tyler    MRN: [id number] DOB: [birth year]  04/29/2019  [name] was observed post Covid-19 immunization for 15 minutes without incident. She was provided with Vaccine Information Sheet and instruction to access the V-Safe system.   [name] was instructed to call 911 with any severe reactions post vaccine: Marland Kitchen Difficulty breathing  . Swelling of face and throat  . A fast heartbeat  . A bad rash all over body  . Dizziness and weakness   Immunizations Administered    Name Date Dose VIS Date Route   Moderna COVID-19 Vaccine 04/29/2019  9:44 AM 0.5 mL 12/20/2018 Intramuscular   Manufacturer: Moderna   Lot: [id number]   NDC: 47159-539-67

## 2019-05-10 ENCOUNTER — Other Ambulatory Visit: Payer: Self-pay | Admitting: Adult Health

## 2019-05-10 MED ORDER — ESCITALOPRAM OXALATE 10 MG PO TABS: 10.0000 mg | ORAL_TABLET | Freq: Every day | ORAL | 2 refills | Status: DC

## 2019-05-10 NOTE — Progress Notes (Signed)
Refill lexapro.  

## 2019-05-11 ENCOUNTER — Other Ambulatory Visit: Payer: Self-pay | Admitting: Adult Health

## 2019-05-11 MED FILL — ESCITALOPRAM 10 MG TABLET: 10 | 90 days supply | Qty: 90 | Fill #0

## 2019-05-27 ENCOUNTER — Ambulatory Visit: Payer: Medicaid Other

## 2019-05-27 ENCOUNTER — Ambulatory Visit: Payer: Medicaid Other | Attending: Internal Medicine

## 2019-05-27 DIAGNOSIS — Z23 Encounter for immunization: Secondary | ICD-10-CM

## 2019-05-27 NOTE — Progress Notes (Signed)
   Covid-19 Vaccination Clinic  Name:  ZYASIA HALBLEIB    MRN: [id number] DOB: 1990/12/01  05/27/2019  Ms. Salim was observed post Covid-19 immunization for 15 minutes without incident. She was provided with Vaccine Information Sheet and instruction to access the V-Safe system.   Ms. Karow was instructed to call 911 with any severe reactions post vaccine: Marland Kitchen Difficulty breathing  . Swelling of face and throat  . A fast heartbeat  . A bad rash all over body  . Dizziness and weakness   Immunizations Administered    Name Date Dose VIS Date Route   Moderna COVID-19 Vaccine 05/27/2019 11:27 AM 0.5 mL 12/2018 Intramuscular   Manufacturer: Moderna   Lot: [id number]   NDC: 83074-600-29

## 2020-05-18 ENCOUNTER — Other Ambulatory Visit: Payer: Self-pay | Admitting: Adult Health

## 2020-05-21 ENCOUNTER — Telehealth: Payer: Self-pay | Admitting: Adult Health

## 2020-05-21 NOTE — Telephone Encounter (Signed)
LMOVM that 90 days supply of Lexapro was sent in to her pharmacy. Advised she would need to establish care with provider there in order to continue getting medication.

## 2020-05-21 NOTE — Telephone Encounter (Signed)
Pt called the nurse line  Pt states she now lives in [place] & is wondering if she needs to find a provider there to manage her meds?  Please advise

## 2021-09-09 LAB — OB RESULTS CONSOLE RPR: RPR: NONREACTIVE

## 2021-09-09 LAB — OB RESULTS CONSOLE GC/CHLAMYDIA
Chlamydia: NEGATIVE
Neisseria Gonorrhea: NEGATIVE

## 2021-09-09 LAB — OB RESULTS CONSOLE HEPATITIS B SURFACE ANTIGEN: Hepatitis B Surface Ag: NEGATIVE

## 2021-09-09 LAB — OB RESULTS CONSOLE ABO/RH: RH Type: POSITIVE

## 2021-09-09 LAB — OB RESULTS CONSOLE ANTIBODY SCREEN: Antibody Screen: NEGATIVE

## 2021-09-09 LAB — OB RESULTS CONSOLE HIV ANTIBODY (ROUTINE TESTING): HIV: NONREACTIVE

## 2021-09-09 LAB — OB RESULTS CONSOLE RUBELLA ANTIBODY, IGM: Rubella: IMMUNE

## 2021-09-09 LAB — OB RESULTS CONSOLE VARICELLA ZOSTER ANTIBODY, IGG: Varicella: IMMUNE

## 2021-09-09 LAB — HEPATITIS C ANTIBODY: HCV Ab: NEGATIVE

## 2021-12-30 ENCOUNTER — Encounter (HOSPITAL_COMMUNITY): Payer: Self-pay | Admitting: Obstetrics and Gynecology

## 2021-12-30 ENCOUNTER — Inpatient Hospital Stay (HOSPITAL_COMMUNITY)
Admission: AD | Admit: 2021-12-30 | Discharge: 2021-12-31 | Disposition: A | Discharge status: Home / Self Care | Payer: Medicaid Other | Attending: Obstetrics and Gynecology | Admitting: Obstetrics and Gynecology

## 2021-12-30 DIAGNOSIS — E860 Dehydration: Secondary | ICD-10-CM | POA: Diagnosis not present

## 2021-12-30 DIAGNOSIS — O212 Late vomiting of pregnancy: Secondary | ICD-10-CM | POA: Insufficient documentation

## 2021-12-30 DIAGNOSIS — E162 Hypoglycemia, unspecified: Secondary | ICD-10-CM | POA: Insufficient documentation

## 2021-12-30 DIAGNOSIS — O99282 Endocrine, nutritional and metabolic diseases complicating pregnancy, second trimester: Secondary | ICD-10-CM | POA: Insufficient documentation

## 2021-12-30 DIAGNOSIS — Z3A26 26 weeks gestation of pregnancy: Secondary | ICD-10-CM | POA: Insufficient documentation

## 2021-12-30 DIAGNOSIS — R42 Dizziness and giddiness: Secondary | ICD-10-CM

## 2021-12-30 HISTORY — DX: Other specified health status: Z78.9

## 2021-12-30 LAB — URINALYSIS, ROUTINE W REFLEX MICROSCOPIC
Bilirubin Urine: NEGATIVE
Glucose, UA: NEGATIVE mg/dL
Hgb urine dipstick: NEGATIVE
Ketones, ur: 80 mg/dL — AB
Nitrite: NEGATIVE
Protein, ur: NEGATIVE mg/dL
Specific Gravity, Urine: 1.014 (ref 1.005–1.030)
pH: 5 (ref 5.0–8.0)

## 2021-12-30 LAB — RAPID URINE DRUG SCREEN, HOSP PERFORMED
Amphetamines: NOT DETECTED
Barbiturates: NOT DETECTED
Benzodiazepines: NOT DETECTED
Cocaine: NOT DETECTED
Opiates: NOT DETECTED
Tetrahydrocannabinol: NOT DETECTED

## 2021-12-30 LAB — GLUCOSE, CAPILLARY
Glucose-Capillary: 53 mg/dL — ABNORMAL LOW (ref 70–99)
Glucose-Capillary: 66 mg/dL — ABNORMAL LOW (ref 70–99)
Glucose-Capillary: 92 mg/dL (ref 70–99)

## 2021-12-30 LAB — WET PREP, GENITAL
Sperm: NONE SEEN
Trich, Wet Prep: NONE SEEN
WBC, Wet Prep HPF POC: >=10 — AB (ref <10)
Yeast Wet Prep HPF POC: NONE SEEN

## 2021-12-30 MED ORDER — LACTATED RINGERS IV BOLUS
1000.0000 mL | Freq: Once | INTRAVENOUS | Status: AC
  Administered 2021-12-30: 1000 mL via INTRAVENOUS

## 2021-12-30 NOTE — MAU Note (Signed)
Hypoglycemic Event  CBG: 66  Treatment: 8 oz juice/soda, crackers and peanut butter  Symptoms: Tired, Blurry vision, dizzy   Follow-up CBG: Time:2357 CBG Result: 92  Possible Reasons for Event: Unknown  Comments/MD notified: Thressa Sheller,  CNM     [name]

## 2021-12-30 NOTE — MAU Provider Note (Incomplete)
History     CSN: [id number]  Arrival date and time: 12/30/21 1935   Event Date/Time   First Provider Initiated Contact with Patient 12/30/21 2220      Chief Complaint  Patient presents with   Nausea   Dizziness   Doris Tyler is a 31 y.o. G2P1000 at [redacted]w[redacted]d who presents today with nausea and dizziness. She states that she has been feeling nauseous since Sunday. Today she went to the grocery store and then when she came home and felt hot and dizzy. She thought it was vertigo bc she had vertigo earlier in her pregnancy. So she sat down at that time and her sister took her BP and it was low. She is also having pressure in her hips and stomach. She states that in the car on the way here she also felt sleepy. Since she has been here the dizziness if getting better. Now she feels like she is contracting. She states that right now contractions are less than 5 mins apart. She is also having pressure in her butt. She denies intercourse or anything in her vagina in the last 24 hours.   She denies any complications with her first pregnancy other than very bad nausea and vomiting. She has been getting prenatal care in [place] and states that everything has been normal so far other than the vertigo, but she has not had vertigo episode in 2 months.   Dizziness This is a new problem. The current episode started today. The problem occurs intermittently. The problem has been resolved. Associated symptoms include nausea. The symptoms are aggravated by standing and walking. She has tried lying down and position changes for the symptoms. The treatment provided mild relief.    OB History     Gravida  2   Para  1   Term  1   Preterm      AB  0   Living  1      SAB      IAB      Ectopic      Multiple      Live Births  1           Past Medical History:  Diagnosis Date   Medical history non-contributory     Past Surgical History:  Procedure Laterality Date    ESOPHAGOGASTRODUODENOSCOPY N/A 01/23/2015   Dr. Gala Romney: reactive gastropathy. no h.pylori   WISDOM TOOTH EXTRACTION      Family History  Problem Relation Age of Onset   Stroke Paternal Grandmother    Diabetes Maternal Grandmother    Diabetes Mother    Diabetes Sister    Colon cancer Neg Hx    Inflammatory bowel disease Neg Hx    Celiac disease Neg Hx     Social History   Tobacco Use   Smoking status: Never   Smokeless tobacco: Never  Vaping Use   Vaping Use: Never used  Substance Use Topics   Alcohol use: Not Currently    Comment: occ   Drug use: No    Allergies: No Known Allergies  Medications Prior to Admission  Medication Sig Dispense Refill Last Dose   escitalopram (LEXAPRO) 10 MG tablet TAKE 1 TABLET BY MOUTH EVERY DAY 30 tablet 3 12/30/2021 at 1100   Multiple Vitamin (MULTIVITAMIN) tablet Take 1 tablet by mouth daily.   12/30/2021 at 1100   megestrol (MEGACE) 40 MG tablet TAKE 2 TABLETS BY MOUTH EVERY DAY (Patient not taking: Reported on 01/25/2019) 60 tablet 3  Review of Systems  Gastrointestinal:  Positive for nausea.  Neurological:  Positive for dizziness.  All other systems reviewed and are negative.  Physical Exam   Blood pressure 107/70, pulse 89, temperature 98.3 F (36.8 C), resp. rate 17, height 5\' 6"  (1.676 m), weight 69.9 kg, SpO2 99 %.  Physical Exam Constitutional:      Appearance: She is well-developed.  HENT:     Head: Normocephalic.  Eyes:     Pupils: Pupils are equal, round, and reactive to light.  Cardiovascular:     Rate and Rhythm: Normal rate and regular rhythm.     Heart sounds: Normal heart sounds.  Pulmonary:     Effort: Pulmonary effort is normal. No respiratory distress.     Breath sounds: Normal breath sounds.  Abdominal:     Palpations: Abdomen is soft.     Tenderness: There is no abdominal tenderness.  Genitourinary:    Vagina: No bleeding. Vaginal discharge: mucusy.    Comments: External: no lesion Vagina: small  amount of white discharge Dilation: Closed Exam by:: Marcille Buffy, CNM   Musculoskeletal:        General: Normal range of motion.     Cervical back: Normal range of motion and neck supple.  Skin:    General: Skin is warm and dry.  Neurological:     Mental Status: She is alert and oriented to person, place, and time.  Psychiatric:        Mood and Affect: Mood normal.        Behavior: Behavior normal.    NST:  Baseline: 130 Variability: moderate Accels: 15x15 Decels: none Toco: irregular Reactive/Appropriate for GA  Patient Vitals for the past 24 hrs:  BP Temp Pulse Resp SpO2 Height Weight  12/30/21 2254 107/70 -- 89 -- 99 % -- --  12/30/21 2215 120/77 -- 81 -- 97 % -- --  12/30/21 2115 122/78 98.3 F (36.8 C) 87 17 100 % 5\' 6"  (1.676 m) 69.9 kg     Results for orders placed or performed during the hospital encounter of 12/30/21 (from the past 24 hour(s))  Urinalysis, Routine w reflex microscopic Urine, Clean Catch     Status: Abnormal   Collection Time: 12/30/21  9:40 PM  Result Value Ref Range   Color, Urine YELLOW YELLOW   APPearance HAZY (A) CLEAR   Specific Gravity, Urine 1.014 1.005 - 1.030   pH 5.0 5.0 - 8.0   Glucose, UA NEGATIVE NEGATIVE mg/dL   Hgb urine dipstick NEGATIVE NEGATIVE   Bilirubin Urine NEGATIVE NEGATIVE   Ketones, ur 80 (A) NEGATIVE mg/dL   Protein, ur NEGATIVE NEGATIVE mg/dL   Nitrite NEGATIVE NEGATIVE   Leukocytes,Ua MODERATE (A) NEGATIVE   RBC / HPF 0-5 0 - 5 RBC/hpf   WBC, UA 0-5 0 - 5 WBC/hpf   Bacteria, UA RARE (A) NONE SEEN   Squamous Epithelial / LPF 6-10 0 - 5   Mucus PRESENT   Rapid urine drug screen (hospital performed)     Status: None   Collection Time: 12/30/21  9:40 PM  Result Value Ref Range   Opiates NONE DETECTED NONE DETECTED   Cocaine NONE DETECTED NONE DETECTED   Benzodiazepines NONE DETECTED NONE DETECTED   Amphetamines NONE DETECTED NONE DETECTED   Tetrahydrocannabinol NONE DETECTED NONE DETECTED   Barbiturates  NONE DETECTED NONE DETECTED  Wet prep, genital     Status: Abnormal   Collection Time: 12/30/21 10:43 PM  Result Value Ref Range   Yeast  Wet Prep HPF POC NONE SEEN NONE SEEN   Trich, Wet Prep NONE SEEN NONE SEEN   Clue Cells Wet Prep HPF POC PRESENT (A) NONE SEEN   WBC, Wet Prep HPF POC >=10 (A) <10   Sperm NONE SEEN   CBC with Differential/Platelet     Status: None   Collection Time: 12/30/21 10:43 PM  Result Value Ref Range   WBC 8.2 4.0 - 10.5 K/uL   RBC 4.13 3.87 - 5.11 MIL/uL   Hemoglobin 13.3 12.0 - 15.0 g/dL   HCT 38.8 36.0 - 46.0 %   MCV 93.9 80.0 - 100.0 fL   MCH 32.2 26.0 - 34.0 pg   MCHC 34.3 30.0 - 36.0 g/dL   RDW 13.0 11.5 - 15.5 %   Platelets 275 150 - 400 K/uL   nRBC 0.0 0.0 - 0.2 %   Neutrophils Relative % 54 %   Neutro Abs 4.5 1.7 - 7.7 K/uL   Lymphocytes Relative 36 %   Lymphs Abs 2.9 0.7 - 4.0 K/uL   Monocytes Relative 7 %   Monocytes Absolute 0.6 0.1 - 1.0 K/uL   Eosinophils Relative 1 %   Eosinophils Absolute 0.1 0.0 - 0.5 K/uL   Basophils Relative 1 %   Basophils Absolute 0.1 0.0 - 0.1 K/uL   Immature Granulocytes 1 %   Abs Immature Granulocytes 0.06 0.00 - 0.07 K/uL  Glucose, capillary     Status: Abnormal   Collection Time: 12/30/21 10:52 PM  Result Value Ref Range   Glucose-Capillary 53 (L) 70 - 99 mg/dL  Glucose, capillary     Status: Abnormal   Collection Time: 12/30/21 11:27 PM  Result Value Ref Range   Glucose-Capillary 66 (L) 70 - 99 mg/dL  Glucose, capillary     Status: None   Collection Time: 12/30/21 11:57 PM  Result Value Ref Range   Glucose-Capillary 92 70 - 99 mg/dL     MAU Course  Procedures  MDM Patient's CBG was 53, given some juice and will reassess. Given history of nausea/dizziness earlier today I suspect that patient's blood sugar was low at that time too.   2336: CBG recheck 66, will give more juice and also some peanut butter crackers. Full 20 min NST completed. Will take off the monitor due to difficulty tracing  active fetus. Will keep on toco for now.   2357: Patient's CBG improved with juice and something to eat.   Will give 2nd bag of fluids due to >80 ketones in urine. Patient clearly dehydrated and hypoglycemic as the cause of her dizziness. However, she has had meclizine in the past that has helped. So will also give a dose today while she is here and send with a RX.   Message sent to FT for patient to get an appt to start prenatal care.   0100: care turned over to [facility], CNM. IV fluids to finish infusing and then patient can be dc home.   Marcille Buffy DNP, CNM  12/31/21  12:32 AM    Assessment and Plan   1. Dehydration   2. Hypoglycemia   3. [redacted] weeks gestation of pregnancy   4. Vertigo    DC home in stable condition  3rd Trimester precautions  PTL precautions  Fetal kick counts RX: meclizine PRN #[id number]  Return to MAU as needed FU with OB as planned   Follow-up Information     [facility] Follow up.   Specialty: Obstetrics and Gynecology Why: They will  call you with an appointment Contact information: [address] [place] [address] [phone]               Reassessment (1:01 AM) -Patient refuses 2nd Liter of fluid and requests discharge. -Nurse to give precautions. -Discharge to home.   Cherre Robins MSN, CNM Advanced Practice Provider, [facility]

## 2021-12-30 NOTE — MAU Provider Note (Incomplete)
History     CSN: [id number]  Arrival date and time: 12/30/21 1935   Event Date/Time   First Provider Initiated Contact with Patient 12/30/21 2220      Chief Complaint  Patient presents with  . Nausea  . Dizziness   Doris Tyler is a 31 y.o. G2P1000 at 1787w6d who presents today with nausea and dizziness. She states that she has been feeling nauseous since Sunday. Today she went to the grocery store and then when she came home and felt hot and dizzy. She thought it was vertigo bc she had vertigo earlier in her pregnancy. So she sat down at that time and her sister took her BP and it was low. She is also having pressure in her hips and stomach. She states that in the car on the way here she also felt sleepy. Since she has been here the dizziness if getting better. Now she feels like she is contracting. She states that right now contractions are less than 5 mins apart. She is also having pressure in her butt. She denies intercourse or anything in her vagina in the last 24 hours.   She denies any complications with her first pregnancy other than very bad nausea and vomiting. She has been getting prenatal care in [place] and states that everything has been normal so far other than the vertigo, but she has not had vertigo episode in 2 months.   Dizziness This is a new problem. The current episode started today. The problem occurs intermittently. The problem has been resolved. Associated symptoms include nausea. The symptoms are aggravated by standing and walking. She has tried lying down and position changes for the symptoms. The treatment provided mild relief.    OB History     Gravida  2   Para  1   Term  1   Preterm      AB  0   Living  1      SAB      IAB      Ectopic      Multiple      Live Births  1           Past Medical History:  Diagnosis Date  . Medical history non-contributory     Past Surgical History:  Procedure Laterality Date  .  ESOPHAGOGASTRODUODENOSCOPY N/A 01/23/2015   Dr. Jena Gaussourk: reactive gastropathy. no h.pylori  . WISDOM TOOTH EXTRACTION      Family History  Problem Relation Age of Onset  . Stroke Paternal Grandmother   . Diabetes Maternal Grandmother   . Diabetes Mother   . Diabetes Sister   . Colon cancer Neg Hx   . Inflammatory bowel disease Neg Hx   . Celiac disease Neg Hx     Social History   Tobacco Use  . Smoking status: Never  . Smokeless tobacco: Never  Vaping Use  . Vaping Use: Never used  Substance Use Topics  . Alcohol use: Not Currently    Comment: occ  . Drug use: No    Allergies: No Known Allergies  Medications Prior to Admission  Medication Sig Dispense Refill Last Dose  . escitalopram (LEXAPRO) 10 MG tablet TAKE 1 TABLET BY MOUTH EVERY DAY 30 tablet 3 12/30/2021 at 1100  . Multiple Vitamin (MULTIVITAMIN) tablet Take 1 tablet by mouth daily.   12/30/2021 at 1100  . megestrol (MEGACE) 40 MG tablet TAKE 2 TABLETS BY MOUTH EVERY DAY (Patient not taking: Reported on 01/25/2019) 60 tablet 3  Review of Systems  Gastrointestinal:  Positive for nausea.  Neurological:  Positive for dizziness.  All other systems reviewed and are negative.  Physical Exam   Blood pressure 107/70, pulse 89, temperature 98.3 F (36.8 C), resp. rate 17, height 5\' 6"  (1.676 m), weight 69.9 kg, SpO2 99 %.  Physical Exam Constitutional:      Appearance: She is well-developed.  HENT:     Head: Normocephalic.  Eyes:     Pupils: Pupils are equal, round, and reactive to light.  Cardiovascular:     Rate and Rhythm: Normal rate and regular rhythm.     Heart sounds: Normal heart sounds.  Pulmonary:     Effort: Pulmonary effort is normal. No respiratory distress.     Breath sounds: Normal breath sounds.  Abdominal:     Palpations: Abdomen is soft.     Tenderness: There is no abdominal tenderness.  Genitourinary:    Vagina: No bleeding. Vaginal discharge: mucusy.    Comments: External: no  lesion Vagina: small amount of white discharge Dilation: Closed Exam by:: Marcille Buffy, CNM   Musculoskeletal:        General: Normal range of motion.     Cervical back: Normal range of motion and neck supple.  Skin:    General: Skin is warm and dry.  Neurological:     Mental Status: She is alert and oriented to person, place, and time.  Psychiatric:        Mood and Affect: Mood normal.        Behavior: Behavior normal.    NST:  Baseline: 130 Variability: moderate Accels: 15x15 Decels: none Toco: irregular Reactive/Appropriate for GA  Patient Vitals for the past 24 hrs:  BP Temp Pulse Resp SpO2 Height Weight  12/30/21 2254 107/70 -- 89 -- 99 % -- --  12/30/21 2215 120/77 -- 81 -- 97 % -- --  12/30/21 2115 122/78 98.3 F (36.8 C) 87 17 100 % 5\' 6"  (1.676 m) 69.9 kg     Results for orders placed or performed during the hospital encounter of 12/30/21 (from the past 24 hour(s))  Urinalysis, Routine w reflex microscopic Urine, Clean Catch     Status: Abnormal   Collection Time: 12/30/21  9:40 PM  Result Value Ref Range   Color, Urine YELLOW YELLOW   APPearance HAZY (A) CLEAR   Specific Gravity, Urine 1.014 1.005 - 1.030   pH 5.0 5.0 - 8.0   Glucose, UA NEGATIVE NEGATIVE mg/dL   Hgb urine dipstick NEGATIVE NEGATIVE   Bilirubin Urine NEGATIVE NEGATIVE   Ketones, ur 80 (A) NEGATIVE mg/dL   Protein, ur NEGATIVE NEGATIVE mg/dL   Nitrite NEGATIVE NEGATIVE   Leukocytes,Ua MODERATE (A) NEGATIVE   RBC / HPF 0-5 0 - 5 RBC/hpf   WBC, UA 0-5 0 - 5 WBC/hpf   Bacteria, UA RARE (A) NONE SEEN   Squamous Epithelial / LPF 6-10 0 - 5   Mucus PRESENT   Rapid urine drug screen (hospital performed)     Status: None   Collection Time: 12/30/21  9:40 PM  Result Value Ref Range   Opiates NONE DETECTED NONE DETECTED   Cocaine NONE DETECTED NONE DETECTED   Benzodiazepines NONE DETECTED NONE DETECTED   Amphetamines NONE DETECTED NONE DETECTED   Tetrahydrocannabinol NONE DETECTED NONE  DETECTED   Barbiturates NONE DETECTED NONE DETECTED  Wet prep, genital     Status: Abnormal   Collection Time: 12/30/21 10:43 PM  Result Value Ref Range   Yeast  Wet Prep HPF POC NONE SEEN NONE SEEN   Trich, Wet Prep NONE SEEN NONE SEEN   Clue Cells Wet Prep HPF POC PRESENT (A) NONE SEEN   WBC, Wet Prep HPF POC >=10 (A) <10   Sperm NONE SEEN   Glucose, capillary     Status: Abnormal   Collection Time: 12/30/21 10:52 PM  Result Value Ref Range   Glucose-Capillary 53 (L) 70 - 99 mg/dL  Glucose, capillary     Status: Abnormal   Collection Time: 12/30/21 11:27 PM  Result Value Ref Range   Glucose-Capillary 66 (L) 70 - 99 mg/dL     MAU Course  Procedures  MDM Patient's CBG was 53, given some juice and will reassess. Given history of nausea/dizziness earlier today I suspect that patient's blood sugar was low at that time too.   2336: CBG recheck 66, will give more juice and also some peanut butter crackers. Full 20 min NST completed. Will take off the monitor due to difficulty tracing active fetus. Will keep on toco for now.   Assessment and Plan  ***  Thressa Sheller 12/30/2021, 11:35 PM

## 2021-12-30 NOTE — MAU Note (Addendum)
.  PEYTIN DECHERT is a 31 y.o. at [redacted]w[redacted]d here in MAU reporting nausea for few days. Dizziness started today while at grocery store. Has had vertigo earlier in pregnancy and Meclazine helped. Has not taken it today as she left it in [place].Came home and sister took B/P and it was 90s for systolic. In car to hospital started having "trouble breathing" on and off. Mild cramping off and on. Some pelvic pressure at times Denies vag bleeding or LOF. Some gray/brown d/c. Baby not moving as much today like usual. Pt has been here since [place]. Has had care in [place] entire pregnancy until coming to [place]. Will probably be staying in [place] due to some unexpected situations with FOB. Encouraged pt to obtain her records from provider in [place]  Onset of complaint: couple days Pain score: 4 Vitals:   12/30/21 2115  Pulse: 87  Resp: 17  Temp: 98.3 F (36.8 C)  SpO2: 100%     FHT:165 Baby moved while listening to [facility] and pt aware of FM Lab orders placed from triage:  u/a

## 2021-12-30 NOTE — Progress Notes (Signed)
Hypoglycemic Event  CBG: 53  Treatment: 8 oz juice/soda  Symptoms: Vision changes, dizzy, tired  Follow-up CXK:GYJE: 2327 CBG Result: 66  Possible Reasons for Event: Unknown  Comments/MD notified: Thressa Sheller, CNM notified     [name]

## 2021-12-31 DIAGNOSIS — Z3A26 26 weeks gestation of pregnancy: Secondary | ICD-10-CM

## 2021-12-31 DIAGNOSIS — E860 Dehydration: Secondary | ICD-10-CM

## 2021-12-31 LAB — COMPREHENSIVE METABOLIC PANEL
ALT: 12 U/L (ref 0–44)
AST: 31 U/L (ref 15–41)
Albumin: 3.3 g/dL — ABNORMAL LOW (ref 3.5–5.0)
Alkaline Phosphatase: 56 U/L (ref 38–126)
Anion gap: 11 (ref 5–15)
BUN: 7 mg/dL (ref 6–20)
CO2: 21 mmol/L — ABNORMAL LOW (ref 22–32)
Calcium: 8.6 mg/dL — ABNORMAL LOW (ref 8.9–10.3)
Chloride: 104 mmol/L (ref 98–111)
Creatinine, Ser: 0.52 mg/dL (ref 0.44–1.00)
GFR, Estimated: >60 mL/min (ref >60)
Glucose, Bld: 75 mg/dL (ref 70–99)
Potassium: 4.7 mmol/L (ref 3.5–5.1)
Sodium: 136 mmol/L (ref 135–145)
Total Bilirubin: 0.7 mg/dL (ref 0.3–1.2)
Total Protein: 6.6 g/dL (ref 6.5–8.1)

## 2021-12-31 LAB — GC/CHLAMYDIA PROBE AMP (~~LOC~~) NOT AT ARMC
Chlamydia: NEGATIVE
Comment: NEGATIVE
Comment: NORMAL
Neisseria Gonorrhea: NEGATIVE

## 2021-12-31 LAB — CBC WITH DIFFERENTIAL/PLATELET
Abs Immature Granulocytes: 0.06 10*3/uL (ref 0.00–0.07)
Basophils Absolute: 0.1 10*3/uL (ref 0.0–0.1)
Basophils Relative: 1 %
Eosinophils Absolute: 0.1 10*3/uL (ref 0.0–0.5)
Eosinophils Relative: 1 %
HCT: 38.8 % (ref 36.0–46.0)
Hemoglobin: 13.3 g/dL (ref 12.0–15.0)
Immature Granulocytes: 1 %
Lymphocytes Relative: 36 %
Lymphs Abs: 2.9 10*3/uL (ref 0.7–4.0)
MCH: 32.2 pg (ref 26.0–34.0)
MCHC: 34.3 g/dL (ref 30.0–36.0)
MCV: 93.9 fL (ref 80.0–100.0)
Monocytes Absolute: 0.6 10*3/uL (ref 0.1–1.0)
Monocytes Relative: 7 %
Neutro Abs: 4.5 10*3/uL (ref 1.7–7.7)
Neutrophils Relative %: 54 %
Platelets: 275 10*3/uL (ref 150–400)
RBC: 4.13 MIL/uL (ref 3.87–5.11)
RDW: 13 % (ref 11.5–15.5)
WBC: 8.2 10*3/uL (ref 4.0–10.5)
nRBC: 0 % (ref 0.0–0.2)

## 2021-12-31 LAB — HIV ANTIBODY (ROUTINE TESTING W REFLEX): HIV Screen 4th Generation wRfx: NONREACTIVE

## 2021-12-31 LAB — RPR: RPR Ser Ql: NONREACTIVE

## 2021-12-31 MED ORDER — MECLIZINE HCL 25 MG PO TABS
25.0000 mg | ORAL_TABLET | Freq: Once | ORAL | Status: AC
  Administered 2021-12-31: 25 mg via ORAL
  Filled 2021-12-31: qty 1

## 2021-12-31 MED ORDER — MECLIZINE HCL 25 MG PO TABS: 25.0000 mg | ORAL_TABLET | Freq: Three times a day (TID) | ORAL | 1 refills | Status: DC | PRN

## 2021-12-31 MED ORDER — LACTATED RINGERS IV BOLUS: 1000.0000 mL | Freq: Once | INTRAVENOUS | Status: DC

## 2022-01-01 LAB — CULTURE, OB URINE

## 2022-01-16 ENCOUNTER — Encounter: Payer: Self-pay | Admitting: Family Medicine

## 2022-01-16 ENCOUNTER — Ambulatory Visit (INDEPENDENT_AMBULATORY_CARE_PROVIDER_SITE_OTHER): Payer: Medicaid Other | Admitting: Family Medicine

## 2022-01-16 ENCOUNTER — Encounter: Payer: Medicaid Other | Admitting: *Deleted

## 2022-01-16 VITALS — BP 106/65 | HR 100 | Wt 156.8 lb

## 2022-01-16 DIAGNOSIS — Z3480 Encounter for supervision of other normal pregnancy, unspecified trimester: Secondary | ICD-10-CM

## 2022-01-16 DIAGNOSIS — Z3A13 13 weeks gestation of pregnancy: Secondary | ICD-10-CM

## 2022-01-16 DIAGNOSIS — Z1332 Encounter for screening for maternal depression: Secondary | ICD-10-CM

## 2022-01-16 DIAGNOSIS — Z3490 Encounter for supervision of normal pregnancy, unspecified, unspecified trimester: Secondary | ICD-10-CM | POA: Insufficient documentation

## 2022-01-16 DIAGNOSIS — Z3481 Encounter for supervision of other normal pregnancy, first trimester: Secondary | ICD-10-CM

## 2022-01-16 MED ORDER — BLOOD PRESSURE MONITOR MISC: 0 refills | Status: DC

## 2022-01-16 NOTE — Progress Notes (Signed)
INITIAL OBSTETRICAL VISIT Patient name: Doris Tyler MRN [id number]  Date of birth: July 08, 1990 Chief Complaint:   Initial Prenatal Visit (Transfer care form [place])  History of Present Illness:   Doris Tyler is a 31 y.o. G47P1001 African-American female at [redacted]w[redacted]d by LMP c/w u/s at 13 weeks with an Estimated Date of Delivery: 04/08/22 being seen today for her initial obstetrical visit.   Patient's last menstrual period was 07/02/2021. Her obstetrical history is significant for G3P1011.   Today she reports fatigue and headaches.  Last pap 1/23. Results were:  Normal per patient      01/16/2022   10:31 AM 01/25/2019   10:27 AM 05/10/2018    9:03 AM 03/15/2018   10:18 AM 03/15/2018   10:17 AM  Depression screen PHQ 2/9  Decreased Interest 1 0 0 1 1  Down, Depressed, Hopeless 1 0 0 1 1  PHQ - 2 Score 2 0 0 2 2  Altered sleeping 1  0 2   Tired, decreased energy 1  0 2   Change in appetite 0  0 0   Feeling bad or failure about yourself  0  0 1   Trouble concentrating 1  0 2   Moving slowly or fidgety/restless 0  0 2   Suicidal thoughts 0  0 0   PHQ-9 Score 5  0 11   Difficult doing work/chores    Extremely dIfficult         01/16/2022   10:35 AM  GAD 7 : Generalized Anxiety Score  Nervous, Anxious, on Edge 1  Control/stop worrying 0  Worry too much - different things 1  Trouble relaxing 1  Restless 0  Easily annoyed or irritable 2  Afraid - awful might happen 0  Total GAD 7 Score 5     Review of Systems:   Pertinent items are noted in HPI Denies cramping/contractions, leakage of fluid, vaginal bleeding, abnormal vaginal discharge w/ itching/odor/irritation, headaches, visual changes, shortness of breath, chest pain, abdominal pain, severe nausea/vomiting, or problems with urination or bowel movements unless otherwise stated above.  Pertinent History Reviewed:  Reviewed past medical,surgical, social, obstetrical and family history.  Reviewed problem list,  medications and allergies. OB History  Gravida Para Term Preterm AB Living  2 1 1    0 1  SAB IAB Ectopic Multiple Live Births          1    # Outcome Date GA Lbr Len/2nd Weight Sex Delivery Anes PTL Lv  2 Current           1 Term 07/28/20 [redacted]w[redacted]d  6 lb 5 oz (2.863 kg) F Vag-Spont None N LIV   Physical Assessment:   Vitals:   01/16/22 1024  BP: 106/65  Pulse: 100  Weight: 156 lb 12.8 oz (71.1 kg)  Body mass index is 25.31 kg/m.       Physical Examination:  General appearance - well appearing, and in no distress  Mental status - alert, oriented to person, place, and time  Psych:  She has a normal mood and affect  Skin - warm and dry, normal color, no suspicious lesions noted  Chest - effort normal  Heart - normal rate   Abdomen - gravid   Extremities:  No swelling or varicosities noted  Pelvic -deterred today   Thin prep pap is not done   Chaperone: N/A    No results found for this or any previous visit (from the past 24 hour(s)).  Assessment & Plan:  1) Low-Risk Pregnancy G2P1001 at [redacted]w[redacted]d with an Estimated Date of Delivery: 04/08/22   2) Initial OB visit  3) 28 wk visit- recent CBC and HIV collected. Will have GTT next week. ROB visit in 2 weeks   Meds:  Meds ordered this encounter  Medications   Blood Pressure Monitor MISC    Sig: For regular home bp monitoring during pregnancy    Dispense:  1 each    Refill:  0    Z34.81 Please mail to patient    Awaiting prenatal labs from previous provider.  Will get 28 week GTT next week  Prenatal visits every 2 weeks  Continue prenatal vitamins Reviewed n/v relief measures and warning s/s to report Reviewed recommended weight gain based on pre-gravid BMI Encouraged well-balanced diet Genetic & carrier screening discussed: Panorama testing previously done  Ultrasound discussed; fetal survey:  Waiting for prenatall record  [place] completed> form faxed if has or is planning to apply for medicaid The nature of [place] for [employer] with multiple MDs and other Advanced Practice Providers was explained to patient; also emphasized that fellows, residents, and students are part of our team. Does not have home bp cuff. Office bp cuff given: yes. Rx sent: yes. Check bp weekly, let us know if consistently >140/90.   Indications for ASA therapy (per uptodate) One of the following: H/O preeclampsia, especially early onset/adverse outcome No Multifetal gestation No CHTN No T1DM or T2DM No Chronic kidney disease No Autoimmune disease (antiphospholipid syndrome, systemic lupus erythematosus) No  OR Two or more of the following: Nulliparity No Obesity (BMI>30 kg/m2) No Family h/o preeclampsia in mother or sister No Age ?35 years No Sociodemographic characteristics (African American race, low socioeconomic level) Yes Personal risk factors (eg, previous pregnancy w/ LBW or SGA, previous adverse pregnancy outcome [eg, stillbirth], interval >10 years between pregnancies) No  Indications for early A1C (per uptodate) BMI >=25 (>=23 in Asian women) AND one of the following GDM in a previous pregnancy No Previous A1C?5.7, impaired glucose tolerance, or impaired fasting glucose on previous testing No First-degree relative with diabetes Yes High-risk race/ethnicity (eg, African American, Latino, Native American, Asian American, Pacific Islander) Yes History of cardiovascular disease No HTN or on therapy for hypertension No HDL cholesterol level <35 mg/dL (0.90 mmol/L) and/or a triglyceride level >250 mg/dL (2.82 mmol/L) No PCOS No Physical inactivity No Other clinical condition associated with insulin resistance (eg, severe obesity, acanthosis nigricans) No Previous birth of an infant weighing ?4000 g No Previous stillbirth of unknown cause No >= 40yo No  Follow-up: No follow-ups on file.   No orders of the defined types were placed in this encounter.   Concepcion Living CNM,  WHNP-BC 01/16/2022 10:44 AM

## 2022-01-23 ENCOUNTER — Other Ambulatory Visit: Payer: Medicaid Other

## 2022-01-23 DIAGNOSIS — Z3480 Encounter for supervision of other normal pregnancy, unspecified trimester: Secondary | ICD-10-CM

## 2022-01-23 DIAGNOSIS — Z3A28 28 weeks gestation of pregnancy: Secondary | ICD-10-CM

## 2022-01-23 DIAGNOSIS — Z131 Encounter for screening for diabetes mellitus: Secondary | ICD-10-CM

## 2022-01-24 LAB — GLUCOSE TOLERANCE, 2 HOURS W/ 1HR
Glucose, 1 hour: 158 mg/dL (ref 70–179)
Glucose, 2 hour: 120 mg/dL (ref 70–152)
Glucose, Fasting: 83 mg/dL (ref 70–91)

## 2022-01-30 ENCOUNTER — Encounter: Payer: Self-pay | Admitting: *Deleted

## 2022-01-30 ENCOUNTER — Ambulatory Visit (INDEPENDENT_AMBULATORY_CARE_PROVIDER_SITE_OTHER): Payer: Medicaid Other | Admitting: Advanced Practice Midwife

## 2022-01-30 VITALS — BP 109/72 | HR 105 | Wt 161.0 lb

## 2022-01-30 DIAGNOSIS — Z3480 Encounter for supervision of other normal pregnancy, unspecified trimester: Secondary | ICD-10-CM

## 2022-01-30 DIAGNOSIS — Z3483 Encounter for supervision of other normal pregnancy, third trimester: Secondary | ICD-10-CM

## 2022-01-30 DIAGNOSIS — Z3A30 30 weeks gestation of pregnancy: Secondary | ICD-10-CM

## 2022-01-30 MED ORDER — BLOOD PRESSURE MONITOR MISC: 0 refills | Status: DC

## 2022-01-30 NOTE — Patient Instructions (Signed)
Milon Dikes, thank you for choosing our office today! We appreciate the opportunity to meet your healthcare needs. You may receive a short survey by mail, e-mail, or through [employer]. If you are happy with your care we would appreciate if you could take just a few minutes to complete the survey questions. We read all of your comments and take your feedback very seriously. Thank you again for choosing our office.  Center for [employer] Team at [place] at [facility] ([place], [place] [address]) Entrance C, located off of [address] parking   CLASSES: Go to [email or web address] to register for classes (childbirth, breastfeeding, waterbirth, infant CPR, daddy bootcamp, etc.)  Call the office [phone]) or go to [facility] if: You begin to have strong, frequent contractions Your water breaks.  Sometimes it is a big gush of fluid, sometimes it is just a trickle that keeps getting your panties wet or running down your legs You have vaginal bleeding.  It is normal to have a small amount of spotting if your cervix was checked.  You don't feel your baby moving like normal.  If you don't, get you something to eat and drink and lay down and focus on feeling your baby move.   If your baby is still not moving like normal, you should call the office or go to [facility].  Call the office [phone]) or go to [facility] hospital for these signs of pre-eclampsia: Severe headache that does not go away with Tylenol Visual changes- seeing spots, double, blurred vision Pain under your right breast or upper abdomen that does not go away with Tums or heartburn medicine Nausea and/or vomiting Severe swelling in your hands, feet, and face   Tdap Vaccine It is recommended that you get the Tdap vaccine during the third trimester of EACH pregnancy to help protect your baby from getting pertussis (whooping cough) 27-36 weeks is the BEST time to do  this so that you can pass the protection on to your baby. During pregnancy is better than after pregnancy, but if you are unable to get it during pregnancy it will be offered at the hospital.  You can get this vaccine with [place], at the health department, your family doctor, or some local pharmacies Everyone who will be around your baby should also be up-to-date on their vaccines before the baby comes. Adults (who are not pregnant) only need 1 dose of Tdap during adulthood.   [facility] Pediatricians/Family Doctors [facility]): [address] [place], [facility]: [address] [place], [phone]                [facility]): [place], [phone] (call to ask if accepting patients) [facility] Department: [address], [place], [place] Pediatricians/Family Doctors [facility]): [place] [place], [address], [facility]: [address] [place], [facility] [place]: [place], [facility]): [phone] [facility]: [address], [place]: [address], [place] [facility]: [phone], [phone]  Home Blood Pressure Monitoring for Patients   Your provider has recommended that you check your  blood pressure (BP) at least once a week at home. If you do not have a blood pressure cuff at home, one will be provided for you. Contact your provider if you have not received your monitor within 1 week.   Helpful Tips for Accurate Home Blood Pressure Checks  Don't smoke, exercise, or drink caffeine 30 minutes before checking your BP Use the restroom before checking your BP (a full bladder can raise your  pressure) Relax in a comfortable upright chair Feet on the ground Left arm resting comfortably on a flat surface at the level of your heart Legs uncrossed Back supported Sit quietly and don't talk Place the cuff on your bare arm Adjust snuggly, so that only two fingertips can fit between your skin and the top of the cuff Check 2 readings separated by at least one minute Keep a log of your BP readings For a visual, please reference this diagram: http://ccnc.care/bpdiagram  Provider Name: [facility]     Phone: [phone]  Zone 1: ALL CLEAR  Continue to monitor your symptoms:  BP reading is less than 140 (top number) or less than 90 (bottom number)  No right upper stomach pain No headaches or seeing spots No feeling nauseated or throwing up No swelling in face and hands  Zone 2: CAUTION Call your doctor's office for any of the following:  BP reading is greater than 140 (top number) or greater than 90 (bottom number)  Stomach pain under your ribs in the middle or right side Headaches or seeing spots Feeling nauseated or throwing up Swelling in face and hands  Zone 3: EMERGENCY  Seek immediate medical care if you have any of the following:  BP reading is greater than160 (top number) or greater than 110 (bottom number) Severe headaches not improving with Tylenol Serious difficulty catching your breath Any worsening symptoms from Zone 2   Third Trimester of Pregnancy The third trimester is from week 29 through week 42, months 7 through 9. The third trimester is a time when the fetus is growing rapidly. At the end of the ninth month, the fetus is about 20 inches in length and weighs 6-10 pounds.  BODY CHANGES Your body goes through many changes during pregnancy. The changes vary from woman to woman.  Your weight will continue to increase. You can expect to gain 25-35 pounds (11-16 kg) by the end of the pregnancy. You may begin to get stretch marks on your hips, abdomen,  and breasts. You may urinate more often because the fetus is moving lower into your pelvis and pressing on your bladder. You may develop or continue to have heartburn as a result of your pregnancy. You may develop constipation because certain hormones are causing the muscles that push waste through your intestines to slow down. You may develop hemorrhoids or swollen, bulging veins (varicose veins). You may have pelvic pain because of the weight gain and pregnancy hormones relaxing your joints between the bones in your pelvis. Backaches may result from overexertion of the muscles supporting your posture. You may have changes in your hair. These can include thickening of your hair, rapid growth, and changes in texture. Some women also have hair loss during or after pregnancy, or hair that feels dry or thin. Your hair will most likely return to normal after your baby is born. Your breasts will continue to grow and be tender. A yellow discharge may leak from your breasts called colostrum. Your belly button may stick out. You may   feel short of breath because of your expanding uterus. You may notice the fetus "dropping," or moving lower in your abdomen. You may have a bloody mucus discharge. This usually occurs a few days to a week before labor begins. Your cervix becomes thin and soft (effaced) near your due date. WHAT TO EXPECT AT YOUR PRENATAL EXAMS  You will have prenatal exams every 2 weeks until week 36. Then, you will have weekly prenatal exams. During a routine prenatal visit: You will be weighed to make sure you and the fetus are growing normally. Your blood pressure is taken. Your abdomen will be measured to track your baby's growth. The fetal heartbeat will be listened to. Any test results from the previous visit will be discussed. You may have a cervical check near your due date to see if you have effaced. At around 36 weeks, your caregiver will check your cervix. At the same time, your  caregiver will also perform a test on the secretions of the vaginal tissue. This test is to determine if a type of bacteria, Group B streptococcus, is present. Your caregiver will explain this further. Your caregiver may ask you: What your birth plan is. How you are feeling. If you are feeling the baby move. If you have had any abnormal symptoms, such as leaking fluid, bleeding, severe headaches, or abdominal cramping. If you have any questions. Other tests or screenings that may be performed during your third trimester include: Blood tests that check for low iron levels (anemia). Fetal testing to check the health, activity level, and growth of the fetus. Testing is done if you have certain medical conditions or if there are problems during the pregnancy. FALSE LABOR You may feel small, irregular contractions that eventually go away. These are called Braxton Hicks contractions, or false labor. Contractions may last for hours, days, or even weeks before true labor sets in. If contractions come at regular intervals, intensify, or become painful, it is best to be seen by your caregiver.  SIGNS OF LABOR  Menstrual-like cramps. Contractions that are 5 minutes apart or less. Contractions that start on the top of the uterus and spread down to the lower abdomen and back. A sense of increased pelvic pressure or back pain. A watery or bloody mucus discharge that comes from the vagina. If you have any of these signs before the 37th week of pregnancy, call your caregiver right away. You need to go to the hospital to get checked immediately. HOME CARE INSTRUCTIONS  Avoid all smoking, herbs, alcohol, and unprescribed drugs. These chemicals affect the formation and growth of the baby. Follow your caregiver's instructions regarding medicine use. There are medicines that are either safe or unsafe to take during pregnancy. Exercise only as directed by your caregiver. Experiencing uterine cramps is a good sign to  stop exercising. Continue to eat regular, healthy meals. Wear a good support bra for breast tenderness. Do not use hot tubs, steam rooms, or saunas. Wear your seat belt at all times when driving. Avoid raw meat, uncooked cheese, cat litter boxes, and soil used by cats. These carry germs that can cause birth defects in the baby. Take your prenatal vitamins. Try taking a stool softener (if your caregiver approves) if you develop constipation. Eat more high-fiber foods, such as fresh vegetables or fruit and whole grains. Drink plenty of fluids to keep your urine clear or pale yellow. Take warm sitz baths to soothe any pain or discomfort caused by hemorrhoids. Use hemorrhoid cream if   your caregiver approves. If you develop varicose veins, wear support hose. Elevate your feet for 15 minutes, 3-4 times a day. Limit salt in your diet. Avoid heavy lifting, wear low heal shoes, and practice good posture. Rest a lot with your legs elevated if you have leg cramps or low back pain. Visit your dentist if you have not gone during your pregnancy. Use a soft toothbrush to brush your teeth and be gentle when you floss. A sexual relationship may be continued unless your caregiver directs you otherwise. Do not travel far distances unless it is absolutely necessary and only with the approval of your caregiver. Take prenatal classes to understand, practice, and ask questions about the labor and delivery. Make a trial run to the hospital. Pack your hospital bag. Prepare the baby's nursery. Continue to go to all your prenatal visits as directed by your caregiver. SEEK MEDICAL CARE IF: You are unsure if you are in labor or if your water has broken. You have dizziness. You have mild pelvic cramps, pelvic pressure, or nagging pain in your abdominal area. You have persistent nausea, vomiting, or diarrhea. You have a bad smelling vaginal discharge. You have pain with urination. SEEK IMMEDIATE MEDICAL CARE IF:  You  have a fever. You are leaking fluid from your vagina. You have spotting or bleeding from your vagina. You have severe abdominal cramping or pain. You have rapid weight loss or gain. You have shortness of breath with chest pain. You notice sudden or extreme swelling of your face, hands, ankles, feet, or legs. You have not felt your baby move in over an hour. You have severe headaches that do not go away with medicine. You have vision changes. Document Released: 12/30/2000 Document Revised: 01/10/2013 Document Reviewed: 03/08/2012 [facility] Patient Information 2015 [place], [place]. This information is not intended to replace advice given to you by your health care provider. Make sure you discuss any questions you have with your health care provider.

## 2022-01-30 NOTE — Progress Notes (Unsigned)
   LOW-RISK PREGNANCY VISIT Patient name: Doris Tyler MRN [id number]  Date of birth: February 01, 1990 Chief Complaint:   Routine Prenatal Visit  History of Present Illness:   Doris Tyler is a 32 y.o. G39P1001 female at [redacted]w[redacted]d with an Estimated Date of Delivery: 04/08/22 being seen today for ongoing management of a low-risk pregnancy.  Today she reports no complaints. Contractions: Not present. Vag. Bleeding: None.  Movement: Present. denies leaking of fluid. Review of Systems:   Pertinent items are noted in HPI Denies abnormal vaginal discharge w/ itching/odor/irritation, headaches, visual changes, shortness of breath, chest pain, abdominal pain, severe nausea/vomiting, or problems with urination or bowel movements unless otherwise stated above. Pertinent History Reviewed:  Reviewed past medical,surgical, social, obstetrical and family history.  Reviewed problem list, medications and allergies. Physical Assessment:   Vitals:   01/30/22 1158  BP: 109/72  Pulse: (!) 105  Weight: 161 lb (73 kg)  Body mass index is 25.99 kg/m.        Physical Examination:   General appearance: Well appearing, and in no distress  Mental status: Alert, oriented to person, place, and time  Skin: Warm & dry  Cardiovascular: Normal heart rate noted  Respiratory: Normal respiratory effort, no distress  Abdomen: Soft, gravid, nontender  Pelvic: Cervical exam deferred         Extremities: Edema: None  Fetal Status: Fetal Heart Rate (bpm): 147 Fundal Height: 29 cm Movement: Present    No results found for this or any previous visit (from the past 24 hour(s)).  Assessment & Plan:  1) Low-risk pregnancy G2P1001 at [redacted]w[redacted]d with an Estimated Date of Delivery: 04/08/22   2) Moving to [place], may be transferring care there and will send ROI if she does   Meds:  Meds ordered this encounter  Medications   Blood Pressure Monitor MISC    Sig: For regular home bp monitoring during pregnancy    Dispense:  1 each     Refill:  0    Z34.81 Please mail to patient   Labs/procedures today: none  Plan:  Continue routine obstetrical care   Reviewed: Preterm labor symptoms and general obstetric precautions including but not limited to vaginal bleeding, contractions, leaking of fluid and fetal movement were reviewed in detail with the patient.  All questions were answered. Doesn't have home bp cuff. Rx given. Check bp weekly, let us know if >140/90.   Follow-up: Return in about 2 weeks (around 02/13/2022) for [place], in person.  No orders of the defined types were placed in this encounter.  Myrtis Ser CNM 01/30/2022 12:18 PM

## 2022-02-12 ENCOUNTER — Ambulatory Visit (INDEPENDENT_AMBULATORY_CARE_PROVIDER_SITE_OTHER): Payer: Medicaid Other | Admitting: Advanced Practice Midwife

## 2022-02-12 VITALS — BP 106/70 | HR 98 | Wt 161.0 lb

## 2022-02-12 DIAGNOSIS — Z23 Encounter for immunization: Secondary | ICD-10-CM | POA: Diagnosis not present

## 2022-02-12 DIAGNOSIS — Z3A32 32 weeks gestation of pregnancy: Secondary | ICD-10-CM

## 2022-02-12 DIAGNOSIS — Z3483 Encounter for supervision of other normal pregnancy, third trimester: Secondary | ICD-10-CM

## 2022-02-12 MED ORDER — ESCITALOPRAM OXALATE 10 MG PO TABS: 10.0000 mg | ORAL_TABLET | Freq: Every day | ORAL | 3 refills | Status: DC

## 2022-02-12 NOTE — Progress Notes (Signed)
   LOW-RISK PREGNANCY VISIT Patient name: Doris Tyler MRN [id number]  Date of birth: 04/16/1990 Chief Complaint:   Routine Prenatal Visit  History of Present Illness:   Doris Tyler is a 32 y.o. G76P1001 female at [redacted]w[redacted]d with an Estimated Date of Delivery: 04/08/22 being seen today for ongoing management of a low-risk pregnancy.  Today she reports normal pregnancy complaints. Had several days of cramping, better today. Some mild dizziness/spots. BPs were normal, eating well. Contractions: Not present. Vag. Bleeding: None.  Movement: Present. denies leaking of fluid. Review of Systems:   Pertinent items are noted in HPI Denies abnormal vaginal discharge w/ itching/odor/irritation, headaches, visual changes, shortness of breath, chest pain, abdominal pain, severe nausea/vomiting, or problems with urination or bowel movements unless otherwise stated above. Pertinent History Reviewed:  Reviewed past medical,surgical, social, obstetrical and family history.  Reviewed problem list, medications and allergies. Physical Assessment:   Vitals:   02/12/22 1151  BP: 106/70  Pulse: 98  Weight: 161 lb (73 kg)  Body mass index is 25.99 kg/m.        Physical Examination:   General appearance: Well appearing, and in no distress  Mental status: Alert, oriented to person, place, and time  Skin: Warm & dry  Cardiovascular: Normal heart rate noted  Respiratory: Normal respiratory effort, no distress  Abdomen: Soft, gravid, nontender  Pelvic: Cervical exam deferred         Extremities:    Fetal Status: Fetal Heart Rate (bpm): 142 Fundal Height: 33 cm Movement: Present    Chaperone:  N/A    No results found for this or any previous visit (from the past 24 hour(s)).  Assessment & Plan:  1) Low-risk pregnancy G2P1001 at [redacted]w[redacted]d with an Estimated Date of Delivery: 04/08/22     Meds:  Meds ordered this encounter  Medications   escitalopram (LEXAPRO) 10 MG tablet    Sig: Take 1 tablet (10 mg  total) by mouth daily.    Dispense:  90 tablet    Refill:  3    Tell pt to make appointment for pap and physical, last refills til seen    Order Specific Question:   Supervising Provider    Answer:   Florian Buff [[id number]]   Labs/procedures today: none  Plan:  Continue routine obstetrical care  Next visit: prefers in person    Reviewed: Preterm labor symptoms and general obstetric precautions including but not limited to vaginal bleeding, contractions, leaking of fluid and fetal movement were reviewed in detail with the patient.  All questions were answered. Has home bp cuff. . Check bp weekly, let us know if >140/90.   Follow-up: No follow-ups on file.  No future appointments.  Orders Placed This Encounter  Procedures   Tdap vaccine greater than or equal to 7yo IM   Christin Fudge DNP, CNM 02/12/2022 12:39 PM

## 2022-02-17 ENCOUNTER — Encounter: Payer: Self-pay | Admitting: Advanced Practice Midwife

## 2022-02-21 ENCOUNTER — Inpatient Hospital Stay (HOSPITAL_COMMUNITY)
Admission: AD | Admit: 2022-02-21 | Discharge: 2022-02-21 | Disposition: A | Discharge status: Home / Self Care | Payer: Medicaid Other | Attending: Obstetrics and Gynecology | Admitting: Obstetrics and Gynecology

## 2022-02-21 ENCOUNTER — Other Ambulatory Visit: Payer: Self-pay

## 2022-02-21 ENCOUNTER — Encounter (HOSPITAL_COMMUNITY): Payer: Self-pay | Admitting: Obstetrics and Gynecology

## 2022-02-21 DIAGNOSIS — Z3A33 33 weeks gestation of pregnancy: Secondary | ICD-10-CM

## 2022-02-21 DIAGNOSIS — O4703 False labor before 37 completed weeks of gestation, third trimester: Secondary | ICD-10-CM | POA: Insufficient documentation

## 2022-02-21 DIAGNOSIS — O479 False labor, unspecified: Secondary | ICD-10-CM | POA: Diagnosis not present

## 2022-02-21 LAB — URINALYSIS, ROUTINE W REFLEX MICROSCOPIC
Bacteria, UA: NONE SEEN
Bilirubin Urine: NEGATIVE
Glucose, UA: NEGATIVE mg/dL
Hgb urine dipstick: NEGATIVE
Ketones, ur: NEGATIVE mg/dL
Nitrite: NEGATIVE
Protein, ur: NEGATIVE mg/dL
Specific Gravity, Urine: 1.013 (ref 1.005–1.030)
pH: 6 (ref 5.0–8.0)

## 2022-02-21 MED ORDER — LACTATED RINGERS IV BOLUS
1000.0000 mL | Freq: Once | INTRAVENOUS | Status: AC
  Administered 2022-02-21: 1000 mL via INTRAVENOUS

## 2022-02-21 NOTE — MAU Provider Note (Signed)
History     CSN: [id number]  Arrival date and time: 02/21/22 1010   Event Date/Time   First Provider Initiated Contact with Patient 02/21/22 1123      Chief Complaint  Patient presents with   Contractions   HPI  [name] is a 32 y.o. female G3P1011 @ [redacted]w[redacted]d here in MAU with contractions. The contractions are mild; she rates them a 5/10. She reports them being very irregular in pattern. She has not taken anything for the pain. She has not bleeding. She reports good fetal movement. She reports feeling well hydrated.   OB History     Gravida  3   Para  1   Term  1   Preterm      AB  1   Living  1      SAB  1   IAB      Ectopic      Multiple      Live Births  1           Past Medical History:  Diagnosis Date   Anxiety    Depression    Medical history non-contributory     Past Surgical History:  Procedure Laterality Date   ESOPHAGOGASTRODUODENOSCOPY N/A 01/23/2015   Dr. Gala Romney: reactive gastropathy. no h.pylori   WISDOM TOOTH EXTRACTION      Family History  Problem Relation Age of Onset   Diabetes Mother    Diabetes Sister    Diabetes Maternal Grandmother    Stroke Paternal Grandmother    Colon cancer Neg Hx    Inflammatory bowel disease Neg Hx    Celiac disease Neg Hx     Social History   Tobacco Use   Smoking status: Never   Smokeless tobacco: Never  Vaping Use   Vaping Use: Never used  Substance Use Topics   Alcohol use: Not Currently    Comment: occ   Drug use: No    Allergies: No Known Allergies  Medications Prior to Admission  Medication Sig Dispense Refill Last Dose   escitalopram (LEXAPRO) 10 MG tablet Take 1 tablet (10 mg total) by mouth daily. 90 tablet 3 02/20/2022   Multiple Vitamin (MULTIVITAMIN) tablet Take 1 tablet by mouth daily.   02/20/2022   Blood Pressure Monitor MISC For regular home bp monitoring during pregnancy 1 each 0    meclizine (ANTIVERT) 25 MG tablet Take 1 tablet (25 mg total) by mouth 3 (three)  times daily as needed for dizziness. (Patient not taking: Reported on 01/16/2022) 30 tablet 1    Results for orders placed or performed during the hospital encounter of 02/21/22 (from the past 48 hour(s))  Urinalysis, Routine w reflex microscopic -Urine, Clean Catch     Status: Abnormal   Collection Time: 02/21/22 11:35 AM  Result Value Ref Range   Color, Urine YELLOW YELLOW   APPearance HAZY (A) CLEAR   Specific Gravity, Urine 1.013 1.005 - 1.030   pH 6.0 5.0 - 8.0   Glucose, UA NEGATIVE NEGATIVE mg/dL   Hgb urine dipstick NEGATIVE NEGATIVE   Bilirubin Urine NEGATIVE NEGATIVE   Ketones, ur NEGATIVE NEGATIVE mg/dL   Protein, ur NEGATIVE NEGATIVE mg/dL   Nitrite NEGATIVE NEGATIVE   Leukocytes,Ua TRACE (A) NEGATIVE   RBC / HPF 0-5 0 - 5 RBC/hpf   WBC, UA 0-5 0 - 5 WBC/hpf   Bacteria, UA NONE SEEN NONE SEEN   Squamous Epithelial / HPF 6-10 0 - 5 /HPF   Mucus PRESENT  Comment: Performed at [facility], [place] [address] [place], [place] [address]    Review of Systems  Gastrointestinal:  Positive for abdominal pain.  Genitourinary:  Negative for vaginal bleeding and vaginal discharge.   Physical Exam   Blood pressure 107/71, pulse 97, temperature 99.1 F (37.3 C), temperature source Oral, resp. rate 18, height 5\' 6"  (1.676 m), weight 72.4 kg, last menstrual period 07/02/2021, SpO2 99 %.  Physical Exam Constitutional:      General: She is not in acute distress.    Appearance: Normal appearance. She is not ill-appearing, toxic-appearing or diaphoretic.  Abdominal:     Palpations: Abdomen is soft.     Tenderness: There is no abdominal tenderness.  Genitourinary:    Comments: Dilation: Closed Effacement (%): Thick Cervical Position: Posterior Exam by:: Altamease Oiler, NP  Musculoskeletal:        General: Normal range of motion.  Skin:    General: Skin is warm.  Neurological:     Mental Status: She is alert and oriented to person, place, and time.    Fetal  Tracing: Baseline: 135 bpm Variability: Moderate  Accelerations: 15x15 Decelerations: None Toco: Occasional.   MAU Course  Procedures  MDM  Lr bolus x 1 given Patient reports significant relief in contraction pain.    Assessment and Plan    A:  1. Braxton Hick's contraction   2. [redacted] weeks gestation of pregnancy     P:  Dc home  Return to MAU if symptoms worsen Rest Stay well hydrated, drink 6-8 ounces of water per day   Lezlie Lye, NP 02/21/2022 4:38 PM

## 2022-02-21 NOTE — MAU Note (Signed)
Doris Tyler is a 32 y.o. at [redacted]w[redacted]d here in MAU reporting: contracting since about 0630 and have gotten closer. They are every 4 min. No bleeding or LOF. +FM  Onset of complaint: today  Pain score: 5/10  Vitals:   02/21/22 1032  BP: 106/70  Pulse: 97  Resp: 18  Temp: 99.1 F (37.3 C)  SpO2: 99%     FHT:140  Lab orders placed from triage: UA, used the restroom in the lobby, did not give sample

## 2022-02-26 ENCOUNTER — Encounter: Payer: Medicaid Other | Admitting: Obstetrics & Gynecology

## 2022-03-06 ENCOUNTER — Ambulatory Visit (INDEPENDENT_AMBULATORY_CARE_PROVIDER_SITE_OTHER): Payer: Medicaid Other | Admitting: Advanced Practice Midwife

## 2022-03-06 VITALS — BP 113/73 | HR 100 | Wt 164.0 lb

## 2022-03-06 DIAGNOSIS — Z3480 Encounter for supervision of other normal pregnancy, unspecified trimester: Secondary | ICD-10-CM

## 2022-03-06 DIAGNOSIS — Z3A35 35 weeks gestation of pregnancy: Secondary | ICD-10-CM

## 2022-03-06 DIAGNOSIS — Z3483 Encounter for supervision of other normal pregnancy, third trimester: Secondary | ICD-10-CM

## 2022-03-06 NOTE — Progress Notes (Signed)
   LOW-RISK PREGNANCY VISIT Patient name: Doris Tyler MRN [id number]  Date of birth: [birth year] Chief Complaint:   Routine Prenatal Visit  History of Present Illness:   Doris Tyler is a 32 y.o. G26P1011 female at 16w2dwith an Estimated Date of Delivery: 04/08/22 being seen today for ongoing management of a low-risk pregnancy.  Today she reports  discomforts of late preg; BH ctx are less if she stays well hydrated . Contractions: Not present. Vag. Bleeding: None.  Movement: Present. denies leaking of fluid. Review of Systems:   Pertinent items are noted in HPI Denies abnormal vaginal discharge w/ itching/odor/irritation, headaches, visual changes, shortness of breath, chest pain, abdominal pain, severe nausea/vomiting, or problems with urination or bowel movements unless otherwise stated above. Pertinent History Reviewed:  Reviewed past medical,surgical, social, obstetrical and family history.  Reviewed problem list, medications and allergies. Physical Assessment:   Vitals:   03/06/22 1148  BP: 113/73  Pulse: 100  Weight: 164 lb (74.4 kg)  Body mass index is 26.47 kg/m.        Physical Examination:   General appearance: Well appearing, and in no distress  Mental status: Alert, oriented to person, place, and time  Skin: Warm & dry  Cardiovascular: Normal heart rate noted  Respiratory: Normal respiratory effort, no distress  Abdomen: Soft, gravid, nontender  Pelvic: Cervical exam deferred         Extremities:    Fetal Status: Fetal Heart Rate (bpm): 141 Fundal Height: 34 cm Movement: Present Presentation: Vertex  No results found for this or any previous visit (from the past 24 hour(s)).  Assessment & Plan:  1) Low-risk pregnancy G3P1011 at 332w2dith an Estimated Date of Delivery: 04/08/22   2) Depression, stable on Lexapro  3) [place] most likely be [place] there and has [facility] appt next week   Meds: No orders of the defined  types were placed in this encounter.  Labs/procedures today: none; GBS/cultures w next visit  Plan:  Continue routine obstetrical care   Reviewed: Preterm labor symptoms and general obstetric precautions including but not limited to vaginal bleeding, contractions, leaking of fluid and fetal movement were reviewed in detail with the patient.  All questions were answered. Didn't ask about home bp cuff. Check bp weekly, let us[place]ow if >140/90.   Follow-up: Return for prn.  No orders of the defined types were placed in this encounter.  KiMyrtis SerNM 03/06/2022 12:17 PM

## 2022-12-16 ENCOUNTER — Ambulatory Visit: Payer: Medicaid Other | Admitting: Family Medicine

## 2022-12-30 ENCOUNTER — Encounter: Payer: Self-pay | Admitting: Family Medicine

## 2023-03-26 ENCOUNTER — Ambulatory Visit: Payer: Self-pay

## 2023-03-26 NOTE — Telephone Encounter (Signed)
 Copied from CRM [id number]. Topic: Clinical - Red Word Triage >> Mar 26, 2023 10:44 AM Macon Large wrote: Red Word that prompted transfer to Nurse Triage: anxiety  Chief Complaint: anxiety Symptoms: Overwhelmed, restless, lack of sleep, anxious, stressful life, sad, lonely Frequency: x 3 months and worsening Pertinent Negatives: Patient denies thoughts of harming self or others Disposition: [] ED /[] Urgent Care (no appt availability in office) / [x] Appointment(In office/virtual)/ []  [facility]/ [] Home Care/ [] Refused Recommended Disposition /[] [facility]/ []  Follow-up with PCP Additional Notes: has two children 2 and 1 year child: in school and working full time.  On medication to help with anxiety and depression - lately medication does not seem to be as effective as in the beginning.  Would like to talk to someone regarding this - also running low on medications and does not want to refill these medications if changes are needed.  Reason for Disposition . MODERATE anxiety (e.g., persistent or frequent anxiety symptoms; interferes with sleep, school, or work)  Answer Assessment - Initial Assessment Questions 1. CONCERN: "Did anything happen that prompted you to call today?"      Anxiety and depression 2. ANXIETY SYMPTOMS: "Can you describe how you (your loved one; patient) have been feeling?" (e.g., tense, restless, panicky, anxious, keyed up, overwhelmed, sense of impending doom).      Overwhelmed, restless, lack of sleep, anxious, stressful life, sad, lonely 3. ONSET: "How long have you been feeling this way?" (e.g., hours, days, weeks)     3 months and worsening 4. SEVERITY: "How would you rate the level of anxiety?" (e.g., 0 - 10; or mild, moderate, severe).     severe 5. FUNCTIONAL IMPAIRMENT: "How have these feelings affected your ability to do daily activities?" "Have you had more difficulty than usual doing your normal daily activities?" (e.g., getting better, same,  worse; self-care, school, work, interactions)     yes 6. HISTORY: "Have you felt this way before?" "Have you ever been diagnosed with an anxiety problem in the past?" (e.g., generalized anxiety disorder, panic attacks, PTSD). If Yes, ask: "How was this problem treated?" (e.g., medicines, counseling, etc.)     Anxiety and depression 7. RISK OF HARM - SUICIDAL IDEATION: "Do you ever have thoughts of hurting or killing yourself?" If Yes, ask:  "Do you have these feelings now?" "Do you have a plan on how you would do this?"     no 8. TREATMENT:  "What has been done so far to treat this anxiety?" (e.g., medicines, relaxation strategies). "What has helped?"     On medications but feel like that are not controlling situation as before 9. TREATMENT - THERAPIST: "Do you have a counselor or therapist? Name?"     no 10. POTENTIAL TRIGGERS: "Do you drink caffeinated beverages (e.g., coffee, colas, teas), and how much daily?" "Do you drink alcohol or use any drugs?" "Have you started any new medicines recently?"       Stopped drinking alcohol x 2 months and not a lot of caffeine 11. PATIENT SUPPORT: "Who is with you now?" "Who do you live with?" "Do you have family or friends who you can talk to?"        no 12. OTHER SYMPTOMS: "Do you have any other symptoms?" (e.g., feeling depressed, trouble concentrating, trouble sleeping, trouble breathing, palpitations or fast heartbeat, chest pain, sweating, nausea, or diarrhea)       trouble concentrating, trouble, increase HR, night sweats  13. PREGNANCY: "Is there any chance you  are pregnant?" "When was your last menstrual period?"       N/a  Protocols used: Anxiety and Panic Attack-A-AH

## 2023-03-29 ENCOUNTER — Ambulatory Visit: Admitting: Family Medicine

## 2023-03-29 ENCOUNTER — Encounter: Payer: Self-pay | Admitting: Family Medicine

## 2023-03-29 VITALS — BP 128/70 | HR 87 | Resp 16 | Ht 66.0 in | Wt 143.0 lb

## 2023-03-29 DIAGNOSIS — E162 Hypoglycemia, unspecified: Secondary | ICD-10-CM | POA: Diagnosis not present

## 2023-03-29 DIAGNOSIS — Z8349 Family history of other endocrine, nutritional and metabolic diseases: Secondary | ICD-10-CM | POA: Diagnosis not present

## 2023-03-29 DIAGNOSIS — Z7689 Persons encountering health services in other specified circumstances: Secondary | ICD-10-CM

## 2023-03-29 DIAGNOSIS — R5383 Other fatigue: Secondary | ICD-10-CM

## 2023-03-29 DIAGNOSIS — F32A Depression, unspecified: Secondary | ICD-10-CM

## 2023-03-29 DIAGNOSIS — R634 Abnormal weight loss: Secondary | ICD-10-CM

## 2023-03-29 MED ORDER — ESCITALOPRAM OXALATE 20 MG PO TABS: 20.0000 mg | ORAL_TABLET | Freq: Every day | ORAL | 2 refills | Status: AC

## 2023-03-29 MED ORDER — BUPROPION HCL ER (XL) 150 MG PO TB24: 150.0000 mg | ORAL_TABLET | Freq: Every day | ORAL | 0 refills | Status: DC

## 2023-03-29 NOTE — Progress Notes (Signed)
 Name: Doris Tyler   MRN: [id number]    DOB: 11-Dec-1990   Date:03/29/2023       Progress Note  Chief Complaint  Patient presents with   Establish Care     Subjective:   Doris Tyler is a 33 y.o. female, presents to clinic to establish care  Hx of depression/fatigue She has been on Lexapro for years 5 years, dose increased about a year ago She wants to est with psychiatry     03/29/2023    1:40 PM 01/16/2022   10:31 AM 01/25/2019   10:27 AM  Depression screen PHQ 2/9  Decreased Interest 1 1 0  Down, Depressed, Hopeless 1 1 0  PHQ - 2 Score 2 2 0  Altered sleeping 3 1   Tired, decreased energy 3 1   Change in appetite 0 0   Feeling bad or failure about yourself  1 0   Trouble concentrating 3 1   Moving slowly or fidgety/restless 0 0   Suicidal thoughts 0 0   PHQ-9 Score 12 5       03/29/2023    1:40 PM 01/16/2022   10:35 AM  GAD 7 : Generalized Anxiety Score  Nervous, Anxious, on Edge 1 1  Control/stop worrying 0 0  Worry too much - different things 1 1  Trouble relaxing 1 1  Restless 0 0  Easily annoyed or irritable 1 2  Afraid - awful might happen 1 0  Total GAD 7 Score 5 5    Stress with life and school being a single mom  38 year old son and 60 year old daughter  No breast feeding  No birth control right now     Current Outpatient Medications:    escitalopram (LEXAPRO) 10 MG tablet, Take 1 tablet (10 mg total) by mouth daily. (Patient taking differently: Take 20 mg by mouth daily.), Disp: 90 tablet, Rfl: 3  Patient Active Problem List   Diagnosis Date Noted   Encounter for supervision of normal pregnancy, antepartum 01/16/2022   Depression 03/15/2018   Mucosal abnormality of stomach     Past Surgical History:  Procedure Laterality Date   ESOPHAGOGASTRODUODENOSCOPY N/A 01/23/2015   Dr. Jena Gauss: reactive gastropathy. no h.pylori   WISDOM TOOTH EXTRACTION      Family History  Problem Relation Age of Onset   Diabetes Mother    Diabetes  Sister    Thyroid disease Maternal Grandmother    Diabetes Maternal Grandmother    Stroke Paternal Grandmother    Colon cancer Neg Hx    Inflammatory bowel disease Neg Hx    Celiac disease Neg Hx     Social History   Tobacco Use   Smoking status: Never   Smokeless tobacco: Never  Vaping Use   Vaping status: Never Used  Substance Use Topics   Alcohol use: Not Currently    Comment: occ   Drug use: No     No Known Allergies  Health Maintenance  Topic Date Due   Cervical Cancer Screening (HPV/Pap Cotest)  08/21/2019   COVID-19 Vaccine (3 - 2024-25 season) 04/11/2023 (Originally 09/20/2022)   INFLUENZA VACCINE  04/19/2023 (Originally 08/20/2022)   DTaP/Tdap/Td (8 - Td or Tdap) 02/13/2032   Hepatitis C Screening  Completed   HIV Screening  Completed   HPV VACCINES  Aged Out    Chart Review Today: I personally reviewed active problem list, medication list, allergies, family history, social history, health maintenance, notes from last encounter, lab results,  imaging with the patient/caregiver today.   Review of Systems  Constitutional: Negative.   HENT: Negative.    Eyes: Negative.   Respiratory: Negative.    Cardiovascular: Negative.   Gastrointestinal: Negative.   Endocrine: Negative.   Genitourinary: Negative.   Musculoskeletal: Negative.   Skin: Negative.   Allergic/Immunologic: Negative.   Neurological: Negative.   Hematological: Negative.   Psychiatric/Behavioral: Negative.    All other systems reviewed and are negative.    Objective:   Vitals:   03/29/23 1341  BP: 128/70  Pulse: 87  Resp: 16  SpO2: 98%  Weight: 143 lb (64.9 kg)  Height: 5\' 6"  (1.676 m)    Body mass index is 23.08 kg/m.  Physical Exam Vitals and nursing note reviewed.  Constitutional:      General: She is not in acute distress.    Appearance: Normal appearance. She is well-developed and normal weight. She is not ill-appearing, toxic-appearing or diaphoretic.  HENT:     Head:  Normocephalic and atraumatic.     Nose: Nose normal.  Eyes:     General:        Right eye: No discharge.        Left eye: No discharge.     Conjunctiva/sclera: Conjunctivae normal.  Neck:     Trachea: No tracheal deviation.  Cardiovascular:     Rate and Rhythm: Normal rate and regular rhythm.     Pulses: Normal pulses.     Heart sounds: Normal heart sounds.  Pulmonary:     Effort: Pulmonary effort is normal. No respiratory distress.     Breath sounds: Normal breath sounds. No stridor.  Skin:    General: Skin is warm and dry.     Findings: No rash.  Neurological:     Mental Status: She is alert.     Motor: No abnormal muscle tone.     Coordination: Coordination normal.  Psychiatric:        Behavior: Behavior normal.      Functional Status Survey: Is the patient deaf or have difficulty hearing?: No Does the patient have difficulty seeing, even when wearing glasses/contacts?: No Does the patient have difficulty concentrating, remembering, or making decisions?: No Does the patient have difficulty walking or climbing stairs?: No Does the patient have difficulty dressing or bathing?: No Does the patient have difficulty doing errands alone such as visiting a doctor's office or shopping?: No Results for orders placed or performed during the hospital encounter of 02/21/22  Urinalysis, Routine w reflex microscopic -Urine, Clean Catch   Collection Time: 02/21/22 11:35 AM  Result Value Ref Range   Color, Urine YELLOW YELLOW   APPearance HAZY (A) CLEAR   Specific Gravity, Urine 1.013 1.005 - 1.030   pH 6.0 5.0 - 8.0   Glucose, UA NEGATIVE NEGATIVE mg/dL   Hgb urine dipstick NEGATIVE NEGATIVE   Bilirubin Urine NEGATIVE NEGATIVE   Ketones, ur NEGATIVE NEGATIVE mg/dL   Protein, ur NEGATIVE NEGATIVE mg/dL   Nitrite NEGATIVE NEGATIVE   Leukocytes,Ua TRACE (A) NEGATIVE   RBC / HPF 0-5 0 - 5 RBC/hpf   WBC, UA 0-5 0 - 5 WBC/hpf   Bacteria, UA NONE SEEN NONE SEEN   Squamous Epithelial  / HPF 6-10 0 - 5 /HPF   Mucus PRESENT       Assessment & Plan:   1. Depression, unspecified depression type (Primary) Pt has been on lexapro for 5+ years but recently sx are worse with associated fatigue, increased stress as well  03/29/2023    1:40 PM 01/16/2022   10:31 AM 01/25/2019   10:27 AM  Depression screen PHQ 2/9  Decreased Interest 1 1 0  Down, Depressed, Hopeless 1 1 0  PHQ - 2 Score 2 2 0  Altered sleeping 3 1   Tired, decreased energy 3 1   Change in appetite 0 0   Feeling bad or failure about yourself  1 0   Trouble concentrating 3 1   Moving slowly or fidgety/restless 0 0   Suicidal thoughts 0 0   PHQ-9 Score 12 5       03/29/2023    1:40 PM 01/16/2022   10:35 AM  GAD 7 : Generalized Anxiety Score  Nervous, Anxious, on Edge 1 1  Control/stop worrying 0 0  Worry too much - different things 1 1  Trouble relaxing 1 1  Restless 0 0  Easily annoyed or irritable 1 2  Afraid - awful might happen 1 0  Total GAD 7 Score 5 5   Refill on lexapro and trial of additional med - wellbutrin to see if it helps with mood/energy Will additionally screen/r/o anemia, hypothyroid Psych referral per pt request  - COMPLETE METABOLIC PANEL WITH GFR - CBC with Differential/Platelet - TSH - T4, free - Ambulatory referral to Psychiatry - buPROPion (WELLBUTRIN XL) 150 MG 24 hr tablet; Take 1 tablet (150 mg total) by mouth daily.  Dispense: 90 tablet; Refill: 0 - escitalopram (LEXAPRO) 20 MG tablet; Take 1 tablet (20 mg total) by mouth daily.  Dispense: 90 tablet; Refill: 2  2. Other fatigue Eval for anemia, hypothyroid - CBC with Differential/Platelet - TSH - T4, free  3. Hypoglycemia Past low blood sugar in chart, recheck glucose and A1c today to ensure that is not causing sx - COMPLETE METABOLIC PANEL WITH GFR - Hemoglobin A1c  4. Family history of thyroid disease  - TSH - T4, free  5. Weight loss Wt Readings from Last 5 Encounters:  03/29/23 143 lb (64.9 kg)   03/06/22 164 lb (74.4 kg)  02/21/22 159 lb 11.2 oz (72.4 kg)  02/12/22 161 lb (73 kg)  01/30/22 161 lb (73 kg)   BMI Readings from Last 5 Encounters:  03/29/23 23.08 kg/m  03/06/22 26.47 kg/m  02/21/22 25.78 kg/m  02/12/22 25.99 kg/m  01/30/22 25.99 kg/m   - COMPLETE METABOLIC PANEL WITH GFR - CBC with Differential/Platelet - TSH - Hemoglobin A1c - T4, free  6. Encounter to establish care with new doctor All hx reviewed and updated today per chart and per given hx    Return for 1-2 months if not into psychiatry .   Danelle Berry, PA-C 03/29/23 2:21 PM

## 2023-03-30 LAB — COMPLETE METABOLIC PANEL WITH GFR
AG Ratio: 1.7 (calc) (ref 1.0–2.5)
ALT: 11 U/L (ref 6–29)
AST: 17 U/L (ref 10–30)
Albumin: 4.8 g/dL (ref 3.6–5.1)
Alkaline phosphatase (APISO): 59 U/L (ref 31–125)
BUN: 13 mg/dL (ref 7–25)
CO2: 28 mmol/L (ref 20–32)
Calcium: 9.5 mg/dL (ref 8.6–10.2)
Chloride: 104 mmol/L (ref 98–110)
Creat: 0.92 mg/dL (ref 0.50–0.97)
Globulin: 2.8 g/dL (ref 1.9–3.7)
Glucose, Bld: 83 mg/dL (ref 65–99)
Potassium: 4.4 mmol/L (ref 3.5–5.3)
Sodium: 137 mmol/L (ref 135–146)
Total Bilirubin: 0.4 mg/dL (ref 0.2–1.2)
Total Protein: 7.6 g/dL (ref 6.1–8.1)
eGFR: 84 mL/min/{1.73_m2} (ref >=60)

## 2023-03-30 LAB — CBC WITH DIFFERENTIAL/PLATELET
Absolute Lymphocytes: 2268 {cells}/uL (ref 850–3900)
Absolute Monocytes: 349 {cells}/uL (ref 200–950)
Basophils Absolute: 29 {cells}/uL (ref 0–200)
Basophils Relative: 0.7 %
Eosinophils Absolute: 50 {cells}/uL (ref 15–500)
Eosinophils Relative: 1.2 %
HCT: 38.8 % (ref 35.0–45.0)
Hemoglobin: 12.7 g/dL (ref 11.7–15.5)
MCH: 30.4 pg (ref 27.0–33.0)
MCHC: 32.7 g/dL (ref 32.0–36.0)
MCV: 92.8 fL (ref 80.0–100.0)
MPV: 10.9 fL (ref 7.5–12.5)
Monocytes Relative: 8.3 %
Neutro Abs: 1504 {cells}/uL (ref 1500–7800)
Neutrophils Relative %: 35.8 %
Platelets: 305 10*3/uL (ref 140–400)
RBC: 4.18 10*6/uL (ref 3.80–5.10)
RDW: 12.4 % (ref 11.0–15.0)
Total Lymphocyte: 54 %
WBC: 4.2 10*3/uL (ref 3.8–10.8)

## 2023-03-30 LAB — HEMOGLOBIN A1C
Hgb A1c MFr Bld: 5.3 %{Hb} (ref <5.7)
Mean Plasma Glucose: 105 mg/dL
eAG (mmol/L): 5.8 mmol/L

## 2023-03-30 LAB — TSH: TSH: 1.3 m[IU]/L

## 2023-03-30 LAB — T4, FREE: Free T4: 0.9 ng/dL (ref 0.8–1.8)

## 2023-04-01 ENCOUNTER — Encounter: Payer: Self-pay | Admitting: Family Medicine

## 2023-04-30 ENCOUNTER — Telehealth: Admitting: Family Medicine

## 2023-04-30 DIAGNOSIS — F32A Depression, unspecified: Secondary | ICD-10-CM | POA: Diagnosis not present

## 2023-04-30 MED ORDER — BUPROPION HCL 75 MG PO TABS: 75.0000 mg | ORAL_TABLET | Freq: Every day | ORAL | 0 refills | Status: AC

## 2023-04-30 NOTE — Progress Notes (Signed)
 Name: Doris Tyler   MRN: [id number]    DOB: [birth year]   Date:04/30/2023       Progress Note  Subjective:    Chief Complaint  Chief Complaint  Patient presents with   Follow-up    Psychiatry referral has not scheduled w/patient yet    I connected with  Alvin Axe on 04/30/23 at  3:20 PM EDT by telephone and verified that I am speaking with the correct person using two identifiers.   I discussed the limitations, risks, security and privacy concerns of performing an evaluation and management service by telephone and the availability of in person appointments. Staff also discussed with the patient that there may be a patient responsible charge related to this service.  Patient verbalized understanding and agreed to proceed with encounter. Patient Location: home Provider Location: [facility] clinic Additional Individuals present: none  HPI She didn't like the wellbutrin  150 PHQ-9 and GAD-7 reviewed and slightly improved compared to last office visit She has not established with psychiatry    04/30/2023    3:12 PM 03/29/2023    1:40 PM 01/16/2022   10:31 AM  Depression screen PHQ 2/9  Decreased Interest 2 1 1   Down, Depressed, Hopeless 0 1 1  PHQ - 2 Score 2 2 2   Altered sleeping 2 3 1   Tired, decreased energy 3 3 1   Change in appetite 0 0 0  Feeling bad or failure about yourself  1 1 0  Trouble concentrating 2 3 1   Moving slowly or fidgety/restless 0 0 0  Suicidal thoughts 0 0 0  PHQ-9 Score 10 12 5       04/30/2023    3:14 PM 03/29/2023    1:40 PM 01/16/2022   10:35 AM  GAD 7 : Generalized Anxiety Score  Nervous, Anxious, on Edge 1 1 1   Control/stop worrying 0 0 0  Worry too much - different things 0 1 1  Trouble relaxing 0 1 1  Restless 1 0 0  Easily annoyed or irritable 1 1 2   Afraid - awful might happen 1 1 0  Total GAD 7 Score 4 5 5         Patient Active Problem List   Diagnosis Date Noted   Depression 03/15/2018   Mucosal abnormality of stomach      Social History   Tobacco Use   Smoking status: Never   Smokeless tobacco: Never  Substance Use Topics   Alcohol use: Not Currently    Comment: occ     Current Outpatient Medications:    buPROPion  (WELLBUTRIN  XL) 150 MG 24 hr tablet, Take 1 tablet (150 mg total) by mouth daily., Disp: 90 tablet, Rfl: 0   escitalopram  (LEXAPRO ) 20 MG tablet, Take 1 tablet (20 mg total) by mouth daily., Disp: 90 tablet, Rfl: 2  No Known Allergies  Chart Review:I personally reviewed active problem list, medication list, allergies, family history, social history, health maintenance, notes from last encounter, lab results, imaging with the patient/caregiver today.   Review of Systems  Constitutional: Negative.   HENT: Negative.    Eyes: Negative.   Respiratory: Negative.    Cardiovascular: Negative.   Gastrointestinal: Negative.   Endocrine: Negative.   Genitourinary: Negative.   Musculoskeletal: Negative.   Skin: Negative.   Allergic/Immunologic: Negative.   Neurological: Negative.   Hematological: Negative.   Psychiatric/Behavioral: Negative.    All other systems reviewed and are negative.    Objective:    Virtual encounter, vitals limited, only able  to obtain the following There were no vitals filed for this visit. There is no height or weight on file to calculate BMI. Nursing Note and Vital Signs reviewed.  Physical Exam Vitals and nursing note reviewed.  Neurological:     Mental Status: She is alert.  Psychiatric:        Mood and Affect: Mood normal.        Behavior: Behavior normal.     PE limited by telephone encounter  No results found for this or any previous visit (from the past 72 hours).  Assessment and Plan:     ICD-10-CM   1. Depression, unspecified depression type  F32.A buPROPion  (WELLBUTRIN ) 75 MG tablet    She will try a lower dose of Wellbutrin  in addition to Lexapro  prior to doing a cross taper of Lexapro  to another SSRI Still recommend psychiatry  services  - I discussed the assessment and treatment plan with the patient. The patient was provided an opportunity to ask questions and all were answered. The patient agreed with the plan and demonstrated an understanding of the instructions.  - The patient was advised to call back or seek an in-person evaluation if the symptoms worsen or if the condition fails to improve as anticipated.  I provided 15 minutes of non-face-to-face time during this encounter.  [name], PA-C 04/30/23 3:47 PM

## 2023-04-30 NOTE — Patient Instructions (Addendum)
 [facility] [phone]  Call to make an appt  I sent in 75 mg dose of wellbutrin to try since the pharmacy said you cannot break your current pill  Please send me a mychart message in a few weeks about how that worked and when your appt is with [facility]

## 2023-05-20 ENCOUNTER — Encounter: Payer: Self-pay | Admitting: Family Medicine

## 2023-05-21 ENCOUNTER — Ambulatory Visit (INDEPENDENT_AMBULATORY_CARE_PROVIDER_SITE_OTHER): Payer: Self-pay | Admitting: Family Medicine

## 2023-05-21 ENCOUNTER — Other Ambulatory Visit: Payer: Self-pay | Admitting: Family Medicine

## 2023-05-21 ENCOUNTER — Encounter: Payer: Self-pay | Admitting: Family Medicine

## 2023-05-21 VITALS — BP 106/71 | HR 90 | Ht 66.0 in | Wt 144.0 lb

## 2023-05-21 DIAGNOSIS — R7301 Impaired fasting glucose: Secondary | ICD-10-CM | POA: Diagnosis not present

## 2023-05-21 DIAGNOSIS — Z1159 Encounter for screening for other viral diseases: Secondary | ICD-10-CM

## 2023-05-21 DIAGNOSIS — E559 Vitamin D deficiency, unspecified: Secondary | ICD-10-CM

## 2023-05-21 DIAGNOSIS — Z114 Encounter for screening for human immunodeficiency virus [HIV]: Secondary | ICD-10-CM

## 2023-05-21 DIAGNOSIS — E038 Other specified hypothyroidism: Secondary | ICD-10-CM

## 2023-05-21 DIAGNOSIS — F3289 Other specified depressive episodes: Secondary | ICD-10-CM

## 2023-05-21 DIAGNOSIS — E7849 Other hyperlipidemia: Secondary | ICD-10-CM

## 2023-05-21 NOTE — Progress Notes (Signed)
 New Patient Office Visit  Subjective:  Patient ID: Doris Tyler, female    DOB: [birth year]  Age: 33 y.o. MRN: [id number]  CC: No chief complaint on file.   HPI Doris Tyler is a 33 y.o. female with past medical history of anxiety depression presents for establishing care.  For the details of today's visit, please refer to the assessment and plan.     Past Medical History:  Diagnosis Date   Anxiety    Depression    Medical history non-contributory     Past Surgical History:  Procedure Laterality Date   ESOPHAGOGASTRODUODENOSCOPY N/A 01/23/2015   Dr. Riley Cheadle: reactive gastropathy. no h.pylori   WISDOM TOOTH EXTRACTION      Family History  Problem Relation Age of Onset   Diabetes Mother    Diabetes Sister    Thyroid disease Maternal Grandmother    Diabetes Maternal Grandmother    Stroke Paternal Grandmother    Colon cancer Neg Hx    Inflammatory bowel disease Neg Hx    Celiac disease Neg Hx     Social History   Socioeconomic History   Marital status: Single    Spouse name: Not on file   Number of children: 2   Years of education: Not on file   Highest education level: Not on file  Occupational History   Occupation: pt placement     Employer: [employer]    Comment: works in [employer]  Tobacco Use   Smoking status: Never   Smokeless tobacco: Never  Vaping Use   Vaping status: Never Used  Substance and Sexual Activity   Alcohol use: Not Currently    Comment: occ   Drug use: No   Sexual activity: Not Currently    Birth control/protection: None  Other Topics Concern   Not on file  Social History Narrative   Single with her 2 kids, son is 1, daughter is 2 (turns 3 in July)   School - in a sociology and anthropology program   Works as a Geneticist, molecular   Social Drivers of Corporate investment banker Strain: Low Risk  (03/29/2023)   Overall Financial Resource Strain (CARDIA)    Difficulty of Paying Living Expenses: Not hard  at all  Food Insecurity: No Food Insecurity (03/29/2023)   Hunger Vital Sign    Worried About Running Out of Food in the Last Year: Never true    Ran Out of Food in the Last Year: Never true  Transportation Needs: No Transportation Needs (03/29/2023)   PRAPARE - Administrator, Civil Service (Medical): No    Lack of Transportation (Non-Medical): No  Physical Activity: Insufficiently Active (03/29/2023)   Exercise Vital Sign    Days of Exercise per Week: 3 days    Minutes of Exercise per Session: 20 min  Stress: Stress Concern Present (03/29/2023)   [employer] of Occupational Health - Occupational Stress Questionnaire    Feeling of Stress : To some extent  Social Connections: Moderately Integrated (03/29/2023)   Social Connection and Isolation Panel [NHANES]    Frequency of Communication with Friends and Family: More than three times a week    Frequency of Social Gatherings with Friends and Family: Twice a week    Attends Religious Services: More than 4 times per year    Active Member of [employer] or Organizations: Yes    Attends Banker Meetings: More than 4 times per year    Marital  Status: Never married  Intimate Partner Violence: Not At Risk (03/29/2023)   Humiliation, Afraid, Rape, and Kick questionnaire    Fear of Current or Ex-Partner: No    Emotionally Abused: No    Physically Abused: No    Sexually Abused: No    ROS Review of Systems  Constitutional:  Negative for chills and fever.  Eyes:  Negative for visual disturbance.  Respiratory:  Negative for chest tightness and shortness of breath.   Neurological:  Negative for dizziness and headaches.    Objective:   Today's Vitals: BP 106/71   Pulse 90   Ht 5\' 6"  (1.676 m)   Wt 144 lb (65.3 kg)   SpO2 99%   BMI 23.24 kg/m   Physical Exam HENT:     Head: Normocephalic.     Mouth/Throat:     Mouth: Mucous membranes are moist.  Cardiovascular:     Rate and Rhythm: Normal rate.     Heart  sounds: Normal heart sounds.  Pulmonary:     Effort: Pulmonary effort is normal.     Breath sounds: Normal breath sounds.  Neurological:     Mental Status: She is alert.      Assessment & Plan:   Other depression Assessment & Plan: The patient reports a long-standing history of anxiety and depression, stating that she is currently struggling significantly with the demands of caring for her two children, school, and work. She denies any suicidal or homicidal ideation. She is currently taking Lexapro  20 mg daily and reports minimal relief of her symptoms. She had previously been prescribed Wellbutrin  75 mg daily but discontinued it due to headaches while on the medication. She now reports some improvement in her anxiety and depression symptoms with the continued use of Lexapro  and over-the-counter magnesium. The patient is requesting a referral for counseling to engage in talk therapy. A referral has been placed as requested. Nonpharmacological interventions for managing anxiety and depression were discussed. The patient was encouraged to continue her current treatment regimen and follow up in six weeks for reassessment.  Patient and/or legal guardian verbally consented to [facility] services about presenting concerns and psychiatric consultation as appropriate.  The services will be billed as appropriate for the patient   Orders: -     Amb ref to Integrated Behavioral [facility]  IFG (impaired fasting glucose) -     Hemoglobin A1c  Vitamin D deficiency -     VITAMIN D 25 Hydroxy (Vit-D Deficiency, Fractures)  Need for hepatitis C screening test -     Hepatitis C antibody  Encounter for screening for HIV -     HIV Antibody (routine testing w rflx)  TSH (thyroid-stimulating hormone deficiency) -     TSH + free T4  Other hyperlipidemia -     Lipid panel -     CMP14+EGFR -     CBC with Differential/Platelet    Note: This chart has been completed using Licensed conveyancer software, and while attempts have been made to ensure accuracy, certain words and phrases may not be transcribed as intended.   Follow-up: Return in about 6 weeks (around 07/02/2023).   Zailee Vallely, FNP

## 2023-05-21 NOTE — Assessment & Plan Note (Signed)
 The patient reports a long-standing history of anxiety and depression, stating that she is currently struggling significantly with the demands of caring for her two children, school, and work. She denies any suicidal or homicidal ideation. She is currently taking Lexapro  20 mg daily and reports minimal relief of her symptoms. She had previously been prescribed Wellbutrin  75 mg daily but discontinued it due to headaches while on the medication. She now reports some improvement in her anxiety and depression symptoms with the continued use of Lexapro  and over-the-counter magnesium. The patient is requesting a referral for counseling to engage in talk therapy. A referral has been placed as requested. Nonpharmacological interventions for managing anxiety and depression were discussed. The patient was encouraged to continue her current treatment regimen and follow up in six weeks for reassessment.  Patient and/or legal guardian verbally consented to [facility] services about presenting concerns and psychiatric consultation as appropriate.  The services will be billed as appropriate for the patient

## 2023-05-21 NOTE — Patient Instructions (Signed)
 I appreciate the opportunity to provide care to you today!    Follow up:  6 weeks  Labs: please stop by the lab today to get your blood drawn (CBC, CMP, TSH, Lipid profile, HgA1c, Vit D)  Screening: HIV and Hep C  Nonpharmacologic management of anxiety and depression  Mindfulness and Meditation Practices like mindfulness meditation can help reduce symptoms by promoting relaxation and present-moment awareness.  Exercise  Regular physical activity has been shown to improve mood and reduce anxiety through the release of endorphins and other neurochemicals.  Healthy Diet Eating a balanced diet rich in fruits, vegetables, whole grains, and lean proteins can support overall mental health.  Sleep Hygiene  Establishing a regular sleep routine and ensuring good sleep quality can significantly impact mood and anxiety levels.  Stress Management Techniques Activities such as yoga, tai chi, and deep breathing exercises can help manage stress.  Social Support Maintaining strong relationships and seeking support from friends, family, or support groups can provide emotional comfort and reduce feelings of isolation.  Lifestyle Modifications Reducing alcohol and caffeine intake, quitting smoking, and avoiding recreational drugs can improve symptoms.  Art and Music Therapy Engaging in creative activities like painting, drawing, or playing music can be therapeutic and help express emotions.  Light Therapy Particularly useful for seasonal affective disorder (SAD), exposure to bright light can help regulate mood. Aaron Aas    Referrals today-  Integrated behavioral health for talk therapy    Please continue to a heart-healthy diet and increase your physical activities. Try to exercise for at least five days a week.    It was a pleasure to see you and I look forward to continuing to work together on your health and well-being. Please do not hesitate to call the office if you need care or have questions  about your care.  In case of emergency, please visit the Emergency Department for urgent care, or contact our clinic at [phone] to schedule an appointment. We're here to help you!   Have a wonderful day and week. With Gratitude, Sanari Offner MSN, FNP-BC

## 2023-05-22 LAB — HEMOGLOBIN A1C
Est. average glucose Bld gHb Est-mCnc: 94 mg/dL
Hgb A1c MFr Bld: 4.9 % (ref 4.8–5.6)

## 2023-05-22 LAB — CBC WITH DIFFERENTIAL/PLATELET
Basophils Absolute: 0.1 10*3/uL (ref 0.0–0.2)
Basos: 1 %
EOS (ABSOLUTE): 0 10*3/uL (ref 0.0–0.4)
Eos: 1 %
Hematocrit: 40.2 % (ref 34.0–46.6)
Hemoglobin: 13.3 g/dL (ref 11.1–15.9)
Immature Grans (Abs): 0 10*3/uL (ref 0.0–0.1)
Immature Granulocytes: 0 %
Lymphocytes Absolute: 1.9 10*3/uL (ref 0.7–3.1)
Lymphs: 52 %
MCH: 31.3 pg (ref 26.6–33.0)
MCHC: 33.1 g/dL (ref 31.5–35.7)
MCV: 95 fL (ref 79–97)
Monocytes Absolute: 0.3 10*3/uL (ref 0.1–0.9)
Monocytes: 8 %
Neutrophils Absolute: 1.5 10*3/uL (ref 1.4–7.0)
Neutrophils: 38 %
Platelets: 318 10*3/uL (ref 150–450)
RBC: 4.25 x10E6/uL (ref 3.77–5.28)
RDW: 12.3 % (ref 11.7–15.4)
WBC: 3.8 10*3/uL (ref 3.4–10.8)

## 2023-05-22 LAB — HIV ANTIBODY (ROUTINE TESTING W REFLEX): HIV Screen 4th Generation wRfx: NONREACTIVE

## 2023-05-22 LAB — CMP14+EGFR
ALT: 13 IU/L (ref 0–32)
AST: 18 IU/L (ref 0–40)
Albumin: 4.7 g/dL (ref 3.9–4.9)
Alkaline Phosphatase: 76 IU/L (ref 44–121)
BUN/Creatinine Ratio: 12 (ref 9–23)
BUN: 10 mg/dL (ref 6–20)
Bilirubin Total: 0.6 mg/dL (ref 0.0–1.2)
CO2: 20 mmol/L (ref 20–29)
Calcium: 9.4 mg/dL (ref 8.7–10.2)
Chloride: 102 mmol/L (ref 96–106)
Creatinine, Ser: 0.81 mg/dL (ref 0.57–1.00)
Globulin, Total: 3 g/dL (ref 1.5–4.5)
Glucose: 87 mg/dL (ref 70–99)
Potassium: 4.6 mmol/L (ref 3.5–5.2)
Sodium: 138 mmol/L (ref 134–144)
Total Protein: 7.7 g/dL (ref 6.0–8.5)
eGFR: 98 mL/min/{1.73_m2} (ref >59)

## 2023-05-22 LAB — HEPATITIS C ANTIBODY: Hep C Virus Ab: NONREACTIVE

## 2023-05-22 LAB — TSH+FREE T4
Free T4: 1.08 ng/dL (ref 0.82–1.77)
TSH: 1.16 u[IU]/mL (ref 0.450–4.500)

## 2023-05-22 LAB — LIPID PANEL
Chol/HDL Ratio: 2.3 ratio (ref 0.0–4.4)
Cholesterol, Total: 216 mg/dL — ABNORMAL HIGH (ref 100–199)
HDL: 93 mg/dL (ref >39)
LDL Chol Calc (NIH): 115 mg/dL — ABNORMAL HIGH (ref 0–99)
Triglycerides: 44 mg/dL (ref 0–149)
VLDL Cholesterol Cal: 8 mg/dL (ref 5–40)

## 2023-05-22 LAB — VITAMIN D 25 HYDROXY (VIT D DEFICIENCY, FRACTURES): Vit D, 25-Hydroxy: 32.8 ng/mL (ref 30.0–100.0)

## 2023-05-22 NOTE — Progress Notes (Signed)
 Please inform the patient that her cholesterol levels are elevated  and I recommend making lifestyle changes. This includes avoiding simple carbohydrates such as cakes, sweet desserts, ice cream, soda (diet or regular), sweet tea, candies, chips, cookies, store-bought juices, excessive alcohol (more than 1-2 drinks per day), lemonade, artificial sweeteners, donuts, coffee creamers, and sugar-free products. Additionally, I advise reducing consumption of greasy, fatty foods and increasing physical activity.  All other labs are stable.

## 2023-06-04 NOTE — Progress Notes (Deleted)
 Psychiatric Initial Adult Assessment   Patient Identification: Doris Tyler MRN:  [id number] Date of Evaluation:  06/04/2023 Referral Source: *** Chief Complaint:  No chief complaint on file.  Visit Diagnosis: No diagnosis found.  History of Present Illness:   Doris Tyler is a 33 y.o. year old female with a history of depression, anxiety, who is referred for depression.   lexapro         Associated Signs/Symptoms: Depression Symptoms:  {DEPRESSION SYMPTOMS:20000} (Hypo) Manic Symptoms:  {BHH MANIC SYMPTOMS:22872} Anxiety Symptoms:  {BHH ANXIETY SYMPTOMS:22873} Psychotic Symptoms:  {BHH PSYCHOTIC SYMPTOMS:22874} PTSD Symptoms: {BHH PTSD SYMPTOMS:22875}  Past Psychiatric History:  Outpatient:  Psychiatry admission:  Previous suicide attempt:  Past trials of medication:  History of violence:  History of head injury:   Previous Psychotropic Medications: {YES/NO:21197}  Substance Abuse History in the last 12 months:  {yes no:314532}  Consequences of Substance Abuse: {BHH CONSEQUENCES OF SUBSTANCE ABUSE:22880}  Past Medical History:  Past Medical History:  Diagnosis Date   Anxiety    Depression    Medical history non-contributory     Past Surgical History:  Procedure Laterality Date   ESOPHAGOGASTRODUODENOSCOPY N/A 01/23/2015   Dr. Riley Cheadle: reactive gastropathy. no h.pylori   WISDOM TOOTH EXTRACTION      Family Psychiatric History: ***  Family History:  Family History  Problem Relation Age of Onset   Diabetes Mother    Diabetes Sister    Thyroid disease Maternal Grandmother    Diabetes Maternal Grandmother    Stroke Paternal Grandmother    Colon cancer Neg Hx    Inflammatory bowel disease Neg Hx    Celiac disease Neg Hx     Social History:   Social History   Socioeconomic History   Marital status: Single    Spouse name: Not on file   Number of children: 2   Years of education: Not on file   Highest education level: Not on file   Occupational History   Occupation: pt placement     Employer: [employer]    Comment: works in [employer]  Tobacco Use   Smoking status: Never   Smokeless tobacco: Never  Vaping Use   Vaping status: Never Used  Substance and Sexual Activity   Alcohol use: Not Currently    Comment: occ   Drug use: No   Sexual activity: Not Currently    Birth control/protection: None  Other Topics Concern   Not on file  Social History Narrative   Single with her 2 kids, son is 1, daughter is 2 (turns 3 in July)   School - in a sociology and anthropology program   Works as a Geneticist, molecular   Social Drivers of Corporate investment banker Strain: Low Risk  (03/29/2023)   Overall Financial Resource Strain (CARDIA)    Difficulty of Paying Living Expenses: Not hard at all  Food Insecurity: No Food Insecurity (03/29/2023)   Hunger Vital Sign    Worried About Running Out of Food in the Last Year: Never true    Ran Out of Food in the Last Year: Never true  Transportation Needs: No Transportation Needs (03/29/2023)   PRAPARE - Administrator, Civil Service (Medical): No    Lack of Transportation (Non-Medical): No  Physical Activity: Insufficiently Active (03/29/2023)   Exercise Vital Sign    Days of Exercise per Week: 3 days    Minutes of Exercise per Session: 20 min  Stress: Stress Concern Present (03/29/2023)  [employer] of Occupational Health - Occupational Stress Questionnaire    Feeling of Stress : To some extent  Social Connections: Moderately Integrated (03/29/2023)   Social Connection and Isolation Panel [NHANES]    Frequency of Communication with Friends and Family: More than three times a week    Frequency of Social Gatherings with Friends and Family: Twice a week    Attends Religious Services: More than 4 times per year    Active Member of Clubs or Organizations: Yes    Attends Engineer, structural: More than 4 times per year    Marital  Status: Never married    Additional Social History: ***  Allergies:  No Known Allergies  Metabolic Disorder Labs: Lab Results  Component Value Date   HGBA1C 4.9 05/21/2023   MPG 105 03/29/2023   No results found for: "PROLACTIN" Lab Results  Component Value Date   CHOL 216 (H) 05/21/2023   TRIG 44 05/21/2023   HDL 93 05/21/2023   CHOLHDL 2.3 05/21/2023   LDLCALC 115 (H) 05/21/2023   LDLCALC 113 (H) 02/16/2018   Lab Results  Component Value Date   TSH 1.160 05/21/2023    Therapeutic Level Labs: No results found for: "LITHIUM" No results found for: "CBMZ" No results found for: "VALPROATE"  Current Medications: Current Outpatient Medications  Medication Sig Dispense Refill   buPROPion  (WELLBUTRIN ) 75 MG tablet Take 1 tablet (75 mg total) by mouth daily with breakfast. (Patient not taking: Reported on 05/21/2023) 30 tablet 0   escitalopram  (LEXAPRO ) 20 MG tablet Take 1 tablet (20 mg total) by mouth daily. 90 tablet 2   No current facility-administered medications for this visit.    Musculoskeletal: Strength & Muscle Tone: N/A Gait & Station: N/A Patient leans: N/A  Psychiatric Specialty Exam: Review of Systems  unknown if currently breastfeeding.There is no height or weight on file to calculate BMI.  General Appearance: {Appearance:22683}  Eye Contact:  {BHH EYE CONTACT:22684}  Speech:  Clear and Coherent  Volume:  Normal  Mood:  {BHH MOOD:22306}  Affect:  {Affect (PAA):22687}  Thought Process:  Coherent  Orientation:  Full (Time, Place, and Person)  Thought Content:  Logical  Suicidal Thoughts:  {ST/HT (PAA):22692}  Homicidal Thoughts:  {ST/HT (PAA):22692}  Memory:  Immediate;   Good  Judgement:  {Judgement (PAA):22694}  Insight:  {Insight (PAA):22695}  Psychomotor Activity:  Normal  Concentration:  Concentration: Good and Attention Span: Good  Recall:  Good  Fund of Knowledge:Good  Language: Good  Akathisia:  No  Handed:  Right  AIMS (if indicated):   not done  Assets:  Communication Skills Desire for Improvement  ADL's:  Intact  Cognition: WNL  Sleep:  {BHH GOOD/FAIR/POOR:22877}   Screenings: GAD-7    Flowsheet Row Office Visit from 05/21/2023 in [facility] Video Visit from 04/30/2023 in [facility] Office Visit from 03/29/2023 in [facility] Initial Prenatal from 01/16/2022 in [facility]  Total GAD-7 Score 7 4 5 5       PHQ2-9    Flowsheet Row Office Visit from 05/21/2023 in [facility] Video Visit from 04/30/2023 in [facility] Office Visit from 03/29/2023 in [facility] Initial Prenatal from 01/16/2022 in [facility] Office Visit from 01/25/2019 in [facility] at [facility] Total Score 4 2 2  2 0  PHQ-9 Total Score 10 10 12 5  --      Flowsheet Row Admission (Discharged) from 02/21/2022 in [facility]S Maternity Assessment Unit Admission (Discharged) from 12/30/2021 in [facility]S Maternity Assessment Unit  C-SSRS RISK CATEGORY No Risk No Risk       Assessment and Plan:  Assessment  Plan   The patient demonstrates the following risk factors for suicide: Chronic risk factors for suicide include: {Chronic Risk Factors for VWUJWJX:91478295}. Acute risk factors for suicide include: {Acute Risk Factors for AOZHYQM:57846962}. Protective factors for this patient include: {Protective Factors for Suicide XBMW:41324401}. Considering these factors, the overall suicide risk at this point appears to be {Desc; low/moderate/high:110033}. Patient {ACTION; IS/IS UUV:25366440} appropriate for outpatient follow up.   Collaboration of Care: {BH OP Collaboration of Care:21014065}  Patient/Guardian was advised Release of Information must be obtained prior to any record release in order  to collaborate their care with an outside provider. Patient/Guardian was advised if they have not already done so to contact the registration department to sign all necessary forms in order for us  to release information regarding their care.   Consent: Patient/Guardian gives verbal consent for treatment and assignment of benefits for services provided during this visit. Patient/Guardian expressed understanding and agreed to proceed.   Todd Fossa, MD 5/16/20254:07 PM

## 2023-06-09 ENCOUNTER — Telehealth: Payer: Self-pay | Admitting: Psychiatry

## 2023-06-09 ENCOUNTER — Ambulatory Visit: Admitting: Psychiatry

## 2023-06-09 NOTE — Telephone Encounter (Signed)
Sent a video visit link through Epic, but the patient didn't sign in. Tried calling for today's appointment, but got no answer. Left a voicemail instructing the patient to contact the office at [phone].  

## 2023-06-24 ENCOUNTER — Other Ambulatory Visit: Payer: Self-pay | Admitting: Family Medicine

## 2023-06-24 DIAGNOSIS — F32A Depression, unspecified: Secondary | ICD-10-CM

## 2023-06-25 ENCOUNTER — Institutional Professional Consult (permissible substitution): Admitting: Professional Counselor

## 2023-06-25 NOTE — Telephone Encounter (Signed)
 No longer current dosing of this medication Requested Prescriptions  Pending Prescriptions Disp Refills   buPROPion  (WELLBUTRIN  XL) 150 MG 24 hr tablet [Pharmacy Med Name: BUPROPION  HCL XL 150 MG TABLET] 90 tablet 0    Sig: TAKE 1 TABLET BY MOUTH EVERY DAY     Psychiatry: Antidepressants - bupropion  Failed - 06/25/2023  9:59 AM      Failed - Valid encounter within last 6 months    Recent Outpatient Visits           1 month ago Depression, unspecified depression type   [facility] [name], PA-C   2 months ago Depression, unspecified depression type   [facility] [name], PA-C              Passed - Cr in normal range and within 360 days    Creat  Date Value Ref Range Status  03/29/2023 0.92 0.50 - 0.97 mg/dL Final   Creatinine, Ser  Date Value Ref Range Status  05/21/2023 0.81 0.57 - 1.00 mg/dL Final         Passed - AST in normal range and within 360 days    AST  Date Value Ref Range Status  05/21/2023 18 0 - 40 IU/L Final         Passed - ALT in normal range and within 360 days    ALT  Date Value Ref Range Status  05/21/2023 13 0 - 32 IU/L Final         Passed - Completed PHQ-2 or PHQ-9 in the last 360 days      Passed - Last BP in normal range    BP Readings from Last 1 Encounters:  05/21/23 106/71

## 2023-07-29 ENCOUNTER — Ambulatory Visit
Admission: RE | Admit: 2023-07-29 | Discharge: 2023-07-29 | Disposition: A | Discharge status: Home / Self Care | Source: Ambulatory Visit | Attending: Family Medicine

## 2023-07-29 VITALS — BP 115/78 | HR 80 | Temp 98.4°F | Resp 19 | Ht 66.0 in | Wt 150.0 lb

## 2023-07-29 DIAGNOSIS — N760 Acute vaginitis: Secondary | ICD-10-CM | POA: Insufficient documentation

## 2023-07-29 LAB — POCT URINE PREGNANCY: Preg Test, Ur: NEGATIVE

## 2023-07-29 NOTE — ED Triage Notes (Signed)
Pt states that she has vaginal discharge and vaginal odor. X2 days 

## 2023-07-29 NOTE — Discharge Instructions (Signed)
 We will be in touch if anything comes back abnormal on your vaginal swab results.  In the meantime, you may do female health probiotics, vaginal boric acid suppositories daily as needed, avoid use of scented soaps, feminine wipes/washes or douches.

## 2023-07-30 ENCOUNTER — Ambulatory Visit (HOSPITAL_COMMUNITY): Payer: Self-pay

## 2023-07-30 ENCOUNTER — Ambulatory Visit: Admitting: Family Medicine

## 2023-07-30 LAB — CERVICOVAGINAL ANCILLARY ONLY
Bacterial Vaginitis (gardnerella): POSITIVE — AB
Candida Glabrata: NEGATIVE
Candida Vaginitis: NEGATIVE
Chlamydia: NEGATIVE
Comment: NEGATIVE
Comment: NEGATIVE
Comment: NEGATIVE
Comment: NEGATIVE
Comment: NEGATIVE
Comment: NORMAL
Neisseria Gonorrhea: NEGATIVE
Trichomonas: NEGATIVE

## 2023-07-30 MED ORDER — METRONIDAZOLE 500 MG PO TABS: 500.0000 mg | ORAL_TABLET | Freq: Two times a day (BID) | ORAL | 0 refills | Status: AC

## 2023-07-30 MED ORDER — METRONIDAZOLE 500 MG PO TABS
500.0000 mg | ORAL_TABLET | Freq: Two times a day (BID) | ORAL | 0 refills | Status: DC
Start: 2023-07-30 — End: 2023-07-30

## 2023-07-30 NOTE — Telephone Encounter (Signed)
 Pt states that she would like her prescription sent to [facility] on [address]

## 2023-08-01 NOTE — ED Provider Notes (Signed)
 [facility]    CSN: [id number] Arrival date & time: 07/29/23  9043      History   Chief Complaint Chief Complaint  Patient presents with   Vaginal Discharge    Vaginal Discharge and vaginal smell - Entered by patient    HPI Doris Tyler is a 33 y.o. female.   Patient presenting today with 2-day history of vaginal discharge and odor.  Denies rashes, lesions, pelvic or abdominal pain, fever, chills, urinary symptoms.  So far not trying anything over-the-counter for symptoms.  LMP 06/17/2023.  Not currently on contraceptives.    Past Medical History:  Diagnosis Date   Anxiety    Depression    Medical history non-contributory     Patient Active Problem List   Diagnosis Date Noted   Depression 03/15/2018   Mucosal abnormality of stomach     Past Surgical History:  Procedure Laterality Date   ESOPHAGOGASTRODUODENOSCOPY N/A 01/23/2015   Dr. Shaaron: reactive gastropathy. no h.pylori   WISDOM TOOTH EXTRACTION      OB History     Gravida  3   Para  1   Term  1   Preterm      AB  1   Living  1      SAB  1   IAB      Ectopic      Multiple      Live Births  1            Home Medications    Prior to Admission medications   Medication Sig Start Date End Date Taking? Authorizing Provider  buPROPion  (WELLBUTRIN ) 75 MG tablet Take 1 tablet (75 mg total) by mouth daily with breakfast. 04/30/23  Yes Tapia, Leisa, PA-C  escitalopram  (LEXAPRO ) 20 MG tablet Take 1 tablet (20 mg total) by mouth daily. 03/29/23  Yes Tapia, Leisa, PA-C  metroNIDAZOLE  (FLAGYL ) 500 MG tablet Take 1 tablet (500 mg total) by mouth 2 (two) times daily for 7 days. 07/30/23 08/06/23  Vonna Sharlet POUR, MD    Family History Family History  Problem Relation Age of Onset   Diabetes Mother    Diabetes Sister    Thyroid disease Maternal Grandmother    Diabetes Maternal Grandmother    Stroke Paternal Grandmother    Colon cancer Neg Hx    Inflammatory bowel disease Neg  Hx    Celiac disease Neg Hx     Social History Social History   Tobacco Use   Smoking status: Never   Smokeless tobacco: Never  Vaping Use   Vaping status: Never Used  Substance Use Topics   Alcohol use: Yes    Comment: occ   Drug use: No     Allergies   Patient has no known allergies.   Review of Systems Review of Systems PER HPI  Physical Exam Triage Vital Signs ED Triage Vitals  Encounter Vitals Group     BP 07/29/23 1007 115/78     Girls Systolic BP Percentile --      Girls Diastolic BP Percentile --      Boys Systolic BP Percentile --      Boys Diastolic BP Percentile --      Pulse Rate 07/29/23 1007 80     Resp 07/29/23 1007 19     Temp 07/29/23 1007 98.4 F (36.9 C)     Temp Source 07/29/23 1007 Oral     SpO2 07/29/23 1007 98 %     Weight 07/29/23 1005  150 lb (68 kg)     Height 07/29/23 1005 5' 6 (1.676 m)     Head Circumference --      Peak Flow --      Pain Score 07/29/23 1005 0     Pain Loc --      Pain Education --      Exclude from Growth Chart --    No data found.  Updated Vital Signs BP 115/78 (BP Location: Right Arm)   Pulse 80   Temp 98.4 F (36.9 C) (Oral)   Resp 19   Ht 5' 6 (1.676 m)   Wt 150 lb (68 kg)   LMP 06/17/2023   SpO2 98%   BMI 24.21 kg/m   Visual Acuity Right Eye Distance:   Left Eye Distance:   Bilateral Distance:    Right Eye Near:   Left Eye Near:    Bilateral Near:     Physical Exam Vitals and nursing note reviewed.  Constitutional:      Appearance: Normal appearance. She is not ill-appearing.  HENT:     Head: Atraumatic.  Eyes:     Extraocular Movements: Extraocular movements intact.     Conjunctiva/sclera: Conjunctivae normal.  Cardiovascular:     Rate and Rhythm: Normal rate.  Pulmonary:     Effort: Pulmonary effort is normal.  Abdominal:     General: Bowel sounds are normal. There is no distension.     Palpations: Abdomen is soft.     Tenderness: There is no abdominal tenderness. There is  no right CVA tenderness, left CVA tenderness or guarding.  Genitourinary:    Comments: GU exam deferred, self swab performed Musculoskeletal:        General: Normal range of motion.     Cervical back: Normal range of motion and neck supple.  Skin:    General: Skin is warm and dry.  Neurological:     Mental Status: She is alert and oriented to person, place, and time.  Psychiatric:        Mood and Affect: Mood normal.        Thought Content: Thought content normal.        Judgment: Judgment normal.      UC Treatments / Results  Labs (all labs ordered are listed, but only abnormal results are displayed) Labs Reviewed  CERVICOVAGINAL ANCILLARY ONLY - Abnormal; Notable for the following components:      Result Value   Bacterial Vaginitis (gardnerella) Positive (*)    All other components within normal limits  POCT URINE PREGNANCY - Normal    EKG   Radiology No results found.  Procedures Procedures (including critical care time)  Medications Ordered in UC Medications - No data to display  Initial Impression / Assessment and Plan / UC Course  I have reviewed the triage vital signs and the nursing notes.  Pertinent labs & imaging results that were available during my care of the patient were reviewed by me and considered in my medical decision making (see chart for details).     Vitals and exam reassuring today, urine pregnancy was performed as she did not have a menstrual cycle last month and this was negative.  Vaginal swab pending for further evaluation of vaginitis symptoms.  Discussed supportive over-the-counter medications, home care and return precautions.  Final Clinical Impressions(s) / UC Diagnoses   Final diagnoses:  Acute vaginitis     Discharge Instructions      We will be in touch if anything  comes back abnormal on your vaginal swab results.  In the meantime, you may do female health probiotics, vaginal boric acid suppositories daily as needed, avoid  use of scented soaps, feminine wipes/washes or douches.    ED Prescriptions   None    PDMP not reviewed this encounter.   Stuart Vernell Norris, PA-C 08/01/23 1328

## 2023-08-04 ENCOUNTER — Encounter: Payer: Self-pay | Admitting: Family Medicine

## 2023-08-11 ENCOUNTER — Other Ambulatory Visit: Payer: Self-pay | Admitting: Family Medicine

## 2023-08-11 DIAGNOSIS — F32A Depression, unspecified: Secondary | ICD-10-CM

## 2023-11-05 ENCOUNTER — Other Ambulatory Visit: Payer: Self-pay | Admitting: Family Medicine

## 2023-11-05 DIAGNOSIS — F32A Depression, unspecified: Secondary | ICD-10-CM

## 2023-11-08 NOTE — Telephone Encounter (Signed)
 Requested medication (s) are due for refill today: yes  Requested medication (s) are on the active medication list: yes  Last refill:  04/30/23  Future visit scheduled: no  Notes to clinic:  another provider listed as PCP, routing for review     Requested Prescriptions  Pending Prescriptions Disp Refills   buPROPion  (WELLBUTRIN ) 75 MG tablet [Pharmacy Med Name: BUPROPION  HCL 75 MG TABLET] 30 tablet 0    Sig: TAKE 1 TABLET BY MOUTH DAILY WITH BREAKFAST.     Psychiatry: Antidepressants - bupropion  Failed - 11/08/2023  4:19 PM      Failed - Valid encounter within last 6 months    Recent Outpatient Visits           6 months ago Depression, unspecified depression type   [facility] Leavy Mole, PA-C   7 months ago Depression, unspecified depression type   [facility] Leavy Mole, PA-C              Passed - Cr in normal range and within 360 days    Creat  Date Value Ref Range Status  03/29/2023 0.92 0.50 - 0.97 mg/dL Final   Creatinine, Ser  Date Value Ref Range Status  05/21/2023 0.81 0.57 - 1.00 mg/dL Final         Passed - AST in normal range and within 360 days    AST  Date Value Ref Range Status  05/21/2023 18 0 - 40 IU/L Final         Passed - ALT in normal range and within 360 days    ALT  Date Value Ref Range Status  05/21/2023 13 0 - 32 IU/L Final         Passed - Completed PHQ-2 or PHQ-9 in the last 360 days      Passed - Last BP in normal range    BP Readings from Last 1 Encounters:  07/29/23 115/78

## 2023-11-17 ENCOUNTER — Ambulatory Visit
Admission: RE | Admit: 2023-11-17 | Discharge: 2023-11-17 | Disposition: A | Discharge status: Home / Self Care | Source: Ambulatory Visit | Attending: Nurse Practitioner | Admitting: Nurse Practitioner

## 2023-11-17 ENCOUNTER — Other Ambulatory Visit: Payer: Self-pay

## 2023-11-17 VITALS — BP 107/74 | HR 85 | Temp 99.3°F | Resp 20

## 2023-11-17 DIAGNOSIS — J069 Acute upper respiratory infection, unspecified: Secondary | ICD-10-CM

## 2023-11-17 LAB — POC COVID19/FLU A&B COMBO
Covid Antigen, POC: NEGATIVE
Influenza A Antigen, POC: NEGATIVE
Influenza B Antigen, POC: NEGATIVE

## 2023-11-17 MED ORDER — BENZONATATE 100 MG PO CAPS: 100.0000 mg | ORAL_CAPSULE | Freq: Three times a day (TID) | ORAL | 0 refills | Status: AC | PRN

## 2023-11-17 NOTE — ED Triage Notes (Addendum)
 Pt reports generalized body aches, nasal congestion, sneezing, coughing, scratchy throat, intermittent fever since Sunday. Pt reports mild change in symptoms with otc medication and nasal sprays.

## 2023-11-17 NOTE — ED Provider Notes (Signed)
 [facility]    CSN: [id number] Arrival date & time: 11/17/23  1230      History   Chief Complaint Chief Complaint  Patient presents with   Nasal Congestion    HPI Doris Tyler is a 33 y.o. female.   Patient presents today with 3-day history of bodyaches and chills, dry cough, nasal congestion and runny nose, postnasal drainage, scratchy throat, sinus pressure and headache, nausea, decreased appetite, and fatigue.  No shortness of breath or chest pain, chest congestion, abdominal pain, vomiting, or diarrhea.  No known sick contacts.  Reports she has been dealing with headaches and pressure in her sinuses for the past couple of weeks, has history of allergies and takes Benadryl as needed.  Has been taking Mucinex and Tylenol for current symptoms with minimal temporary improvement.    Past Medical History:  Diagnosis Date   Anxiety    Depression    Medical history non-contributory     Patient Active Problem List   Diagnosis Date Noted   Depression 03/15/2018   Mucosal abnormality of stomach     Past Surgical History:  Procedure Laterality Date   ESOPHAGOGASTRODUODENOSCOPY N/A 01/23/2015   Dr. Shaaron: reactive gastropathy. no h.pylori   WISDOM TOOTH EXTRACTION      OB History     Gravida  3   Para  1   Term  1   Preterm      AB  1   Living  1      SAB  1   IAB      Ectopic      Multiple      Live Births  1            Home Medications    Prior to Admission medications   Medication Sig Start Date End Date Taking? Authorizing Provider  benzonatate (TESSALON) 100 MG capsule Take 1 capsule (100 mg total) by mouth 3 (three) times daily as needed for cough. Do not take with alcohol or while operating or driving heavy machinery 89/70/74  Yes Chandra Harlene LABOR, NP  buPROPion  (WELLBUTRIN ) 75 MG tablet Take 1 tablet (75 mg total) by mouth daily with breakfast. 04/30/23   Tapia, Leisa, PA-C  escitalopram  (LEXAPRO ) 20 MG tablet Take 1  tablet (20 mg total) by mouth daily. 03/29/23   Leavy Mole, PA-C    Family History Family History  Problem Relation Age of Onset   Diabetes Mother    Diabetes Sister    Thyroid disease Maternal Grandmother    Diabetes Maternal Grandmother    Stroke Paternal Grandmother    Colon cancer Neg Hx    Inflammatory bowel disease Neg Hx    Celiac disease Neg Hx     Social History Social History   Tobacco Use   Smoking status: Never   Smokeless tobacco: Never  Vaping Use   Vaping status: Never Used  Substance Use Topics   Alcohol use: Yes    Comment: occ   Drug use: No     Allergies   Patient has no known allergies.   Review of Systems Review of Systems Per HPI  Physical Exam Triage Vital Signs ED Triage Vitals  Encounter Vitals Group     BP 11/17/23 1344 107/74     Girls Systolic BP Percentile --      Girls Diastolic BP Percentile --      Boys Systolic BP Percentile --      Boys Diastolic BP Percentile --  Pulse Rate 11/17/23 1344 85     Resp 11/17/23 1344 20     Temp 11/17/23 1344 99.3 F (37.4 C)     Temp Source 11/17/23 1344 Oral     SpO2 11/17/23 1344 95 %     Weight --      Height --      Head Circumference --      Peak Flow --      Pain Score 11/17/23 1343 7     Pain Loc --      Pain Education --      Exclude from Growth Chart --    No data found.  Updated Vital Signs BP 107/74 (BP Location: Right Arm)   Pulse 85   Temp 99.3 F (37.4 C) (Oral)   Resp 20   LMP 11/07/2023 (Approximate)   SpO2 95%   Breastfeeding No   Visual Acuity Right Eye Distance:   Left Eye Distance:   Bilateral Distance:    Right Eye Near:   Left Eye Near:    Bilateral Near:     Physical Exam Vitals and nursing note reviewed.  Constitutional:      General: She is not in acute distress.    Appearance: Normal appearance. She is not ill-appearing or toxic-appearing.  HENT:     Head: Normocephalic and atraumatic.     Right Ear: Tympanic membrane, ear canal  and external ear normal.     Left Ear: Tympanic membrane, ear canal and external ear normal.     Nose: No congestion or rhinorrhea.     Right Sinus: No maxillary sinus tenderness or frontal sinus tenderness.     Left Sinus: No maxillary sinus tenderness or frontal sinus tenderness.     Mouth/Throat:     Mouth: Mucous membranes are moist.     Pharynx: Oropharynx is clear. No oropharyngeal exudate, posterior oropharyngeal erythema or postnasal drip.  Eyes:     General: No scleral icterus.    Extraocular Movements: Extraocular movements intact.  Cardiovascular:     Rate and Rhythm: Normal rate and regular rhythm.  Pulmonary:     Effort: Pulmonary effort is normal. No respiratory distress.     Breath sounds: Normal breath sounds. No wheezing, rhonchi or rales.  Musculoskeletal:     Cervical back: Normal range of motion and neck supple.  Lymphadenopathy:     Cervical: No cervical adenopathy.  Skin:    General: Skin is warm and dry.     Coloration: Skin is not jaundiced or pale.     Findings: No erythema or rash.  Neurological:     Mental Status: She is alert and oriented to person, place, and time.  Psychiatric:        Behavior: Behavior is cooperative.      UC Treatments / Results  Labs (all labs ordered are listed, but only abnormal results are displayed) Labs Reviewed  POC COVID19/FLU A&B COMBO    EKG   Radiology No results found.  Procedures Procedures (including critical care time)  Medications Ordered in UC Medications - No data to display  Initial Impression / Assessment and Plan / UC Course  I have reviewed the triage vital signs and the nursing notes.  Pertinent labs & imaging results that were available during my care of the patient were reviewed by me and considered in my medical decision making (see chart for details).   Patient is well-appearing, normotensive, afebrile, not tachycardic, not tachypneic, oxygenating well on room air.  1. Viral URI with  cough Vitals and exam are reassuring today COVID-19 and influenza testing is negative Supportive care discussed with patient, continue Mucinex, start cough suppressant medication and nasal saline Return and ER precautions discussed  work excuse provided  The patient was given the opportunity to ask questions.  All questions answered to their satisfaction.  The patient is in agreement to this plan.   Final Clinical Impressions(s) / UC Diagnoses   Final diagnoses:  Viral URI with cough     Discharge Instructions      You have a viral upper respiratory infection.  COVID-19 and influenza test is negative today.  Symptoms should improve over the next week to 10 days.  If you develop chest pain or shortness of breath, go to the emergency room.  Some things that can make you feel better are: - Increased rest - Increasing fluid with water /sugar free electrolytes - Acetaminophen and ibuprofen as needed for fever/pain - Salt water  gargling, chloraseptic spray and throat lozenges - OTC guaifenesin (Mucinex) 600 mg twice daily for congestion - Saline sinus flushes or a neti pot - Humidifying the air -Tessalon Perles every 8 hours as needed for dry cough     ED Prescriptions     Medication Sig Dispense Auth. Provider   benzonatate (TESSALON) 100 MG capsule Take 1 capsule (100 mg total) by mouth 3 (three) times daily as needed for cough. Do not take with alcohol or while operating or driving heavy machinery 21 capsule Chandra Harlene LABOR, NP      PDMP not reviewed this encounter.   Chandra Harlene LABOR, NP 11/17/23 1427

## 2023-11-17 NOTE — Discharge Instructions (Signed)
 You have a viral upper respiratory infection.  COVID-19 and influenza test is negative today.  Symptoms should improve over the next week to 10 days.  If you develop chest pain or shortness of breath, go to the emergency room.  Some things that can make you feel better are: - Increased rest - Increasing fluid with water /sugar free electrolytes - Acetaminophen and ibuprofen as needed for fever/pain - Salt water  gargling, chloraseptic spray and throat lozenges - OTC guaifenesin (Mucinex) 600 mg twice daily for congestion - Saline sinus flushes or a neti pot - Humidifying the air -Tessalon Perles every 8 hours as needed for dry cough

## 2023-12-01 ENCOUNTER — Encounter: Payer: Self-pay | Admitting: Family Medicine
